# Patient Record
Sex: Female | Born: 1970
Health system: Southern US, Community
[De-identification: ages and names within clinical notes are randomized; demographics above are authoritative.]

## PROBLEM LIST (undated history)

## (undated) DIAGNOSIS — IMO0001 Reserved for inherently not codable concepts without codable children: Secondary | ICD-10-CM

## (undated) DIAGNOSIS — D649 Anemia, unspecified: Secondary | ICD-10-CM

## (undated) DIAGNOSIS — G8929 Other chronic pain: Secondary | ICD-10-CM

## (undated) DIAGNOSIS — I319 Disease of pericardium, unspecified: Secondary | ICD-10-CM

## (undated) DIAGNOSIS — M1990 Unspecified osteoarthritis, unspecified site: Secondary | ICD-10-CM

## (undated) DIAGNOSIS — D7581 Myelofibrosis: Secondary | ICD-10-CM

## (undated) DIAGNOSIS — R519 Headache, unspecified: Secondary | ICD-10-CM

## (undated) DIAGNOSIS — Z5189 Encounter for other specified aftercare: Secondary | ICD-10-CM

## (undated) HISTORY — DX: Unspecified osteoarthritis, unspecified site: M19.90

## (undated) HISTORY — DX: Other chronic pain: G89.29

## (undated) HISTORY — DX: Encounter for other specified aftercare: Z51.89

## (undated) HISTORY — DX: Headache, unspecified: R51.9

## (undated) HISTORY — DX: Anemia, unspecified: D64.9

## (undated) HISTORY — DX: Disease of pericardium, unspecified: I31.9

## (undated) HISTORY — PX: PORT A CATH REVISION: SHX6033

---

## 1998-03-16 ENCOUNTER — Ambulatory Visit (HOSPITAL_COMMUNITY): Admission: RE | Admit: 1998-03-16 | Discharge: 1998-03-16 | Payer: Self-pay | Admitting: Emergency Medicine

## 1998-03-16 ENCOUNTER — Emergency Department (HOSPITAL_COMMUNITY): Admission: EM | Admit: 1998-03-16 | Discharge: 1998-03-16 | Payer: Self-pay | Admitting: Emergency Medicine

## 1998-12-15 ENCOUNTER — Other Ambulatory Visit: Admission: RE | Admit: 1998-12-15 | Discharge: 1998-12-15 | Payer: Self-pay | Admitting: Obstetrics and Gynecology

## 2000-01-19 ENCOUNTER — Other Ambulatory Visit: Admission: RE | Admit: 2000-01-19 | Discharge: 2000-01-19 | Payer: Self-pay | Admitting: Obstetrics and Gynecology

## 2000-03-13 ENCOUNTER — Encounter: Payer: Self-pay | Admitting: Emergency Medicine

## 2000-03-13 ENCOUNTER — Emergency Department (HOSPITAL_COMMUNITY): Admission: EM | Admit: 2000-03-13 | Discharge: 2000-03-13 | Payer: Self-pay | Admitting: Emergency Medicine

## 2000-07-19 ENCOUNTER — Encounter: Payer: Self-pay | Admitting: Family Medicine

## 2000-07-19 ENCOUNTER — Encounter: Admission: RE | Admit: 2000-07-19 | Discharge: 2000-07-19 | Payer: Self-pay | Admitting: Family Medicine

## 2000-07-25 ENCOUNTER — Other Ambulatory Visit: Admission: RE | Admit: 2000-07-25 | Discharge: 2000-07-25 | Payer: Self-pay | Admitting: Family Medicine

## 2000-11-01 ENCOUNTER — Emergency Department (HOSPITAL_COMMUNITY): Admission: EM | Admit: 2000-11-01 | Discharge: 2000-11-02 | Payer: Self-pay | Admitting: *Deleted

## 2000-12-17 ENCOUNTER — Ambulatory Visit (HOSPITAL_COMMUNITY): Admission: RE | Admit: 2000-12-17 | Discharge: 2000-12-17 | Payer: Self-pay | Admitting: Obstetrics and Gynecology

## 2000-12-17 ENCOUNTER — Encounter: Payer: Self-pay | Admitting: Obstetrics and Gynecology

## 2001-01-04 ENCOUNTER — Emergency Department (HOSPITAL_COMMUNITY): Admission: EM | Admit: 2001-01-04 | Discharge: 2001-01-04 | Payer: Self-pay | Admitting: Emergency Medicine

## 2001-01-04 ENCOUNTER — Encounter: Payer: Self-pay | Admitting: Emergency Medicine

## 2001-01-28 ENCOUNTER — Ambulatory Visit (HOSPITAL_COMMUNITY): Admission: RE | Admit: 2001-01-28 | Discharge: 2001-01-28 | Payer: Self-pay | Admitting: Obstetrics and Gynecology

## 2001-01-28 ENCOUNTER — Encounter: Payer: Self-pay | Admitting: Obstetrics and Gynecology

## 2001-03-12 ENCOUNTER — Ambulatory Visit (HOSPITAL_COMMUNITY): Admission: RE | Admit: 2001-03-12 | Discharge: 2001-03-12 | Payer: Self-pay | Admitting: Obstetrics and Gynecology

## 2001-03-12 ENCOUNTER — Encounter (INDEPENDENT_AMBULATORY_CARE_PROVIDER_SITE_OTHER): Payer: Self-pay

## 2001-03-12 ENCOUNTER — Encounter (INDEPENDENT_AMBULATORY_CARE_PROVIDER_SITE_OTHER): Payer: Self-pay | Admitting: Specialist

## 2001-03-27 ENCOUNTER — Encounter: Admission: RE | Admit: 2001-03-27 | Discharge: 2001-04-18 | Payer: Self-pay | Admitting: Family Medicine

## 2001-04-22 ENCOUNTER — Emergency Department (HOSPITAL_COMMUNITY): Admission: EM | Admit: 2001-04-22 | Discharge: 2001-04-22 | Payer: Self-pay | Admitting: Emergency Medicine

## 2001-06-20 ENCOUNTER — Encounter: Payer: Self-pay | Admitting: Emergency Medicine

## 2001-06-20 ENCOUNTER — Emergency Department (HOSPITAL_COMMUNITY): Admission: EM | Admit: 2001-06-20 | Discharge: 2001-06-20 | Payer: Self-pay | Admitting: Emergency Medicine

## 2001-07-24 ENCOUNTER — Ambulatory Visit (HOSPITAL_COMMUNITY): Admission: RE | Admit: 2001-07-24 | Discharge: 2001-07-24 | Payer: Self-pay | Admitting: Infectious Diseases

## 2001-07-24 ENCOUNTER — Encounter: Payer: Self-pay | Admitting: Infectious Diseases

## 2001-07-24 ENCOUNTER — Encounter: Admission: RE | Admit: 2001-07-24 | Discharge: 2001-07-24 | Payer: Self-pay | Admitting: Infectious Diseases

## 2001-08-01 ENCOUNTER — Encounter: Admission: RE | Admit: 2001-08-01 | Discharge: 2001-08-01 | Payer: Self-pay | Admitting: Infectious Diseases

## 2001-08-02 ENCOUNTER — Ambulatory Visit (HOSPITAL_COMMUNITY): Admission: RE | Admit: 2001-08-02 | Discharge: 2001-08-02 | Payer: Self-pay | Admitting: Infectious Diseases

## 2001-08-06 ENCOUNTER — Encounter: Admission: RE | Admit: 2001-08-06 | Discharge: 2001-08-06 | Payer: Self-pay | Admitting: Infectious Diseases

## 2001-08-07 ENCOUNTER — Ambulatory Visit (HOSPITAL_COMMUNITY): Admission: RE | Admit: 2001-08-07 | Discharge: 2001-08-07 | Payer: Self-pay | Admitting: Infectious Diseases

## 2001-08-12 ENCOUNTER — Ambulatory Visit (HOSPITAL_COMMUNITY): Admission: RE | Admit: 2001-08-12 | Discharge: 2001-08-12 | Payer: Self-pay | Admitting: Infectious Diseases

## 2001-08-12 ENCOUNTER — Encounter: Payer: Self-pay | Admitting: Infectious Diseases

## 2001-08-21 ENCOUNTER — Encounter: Admission: RE | Admit: 2001-08-21 | Discharge: 2001-08-21 | Payer: Self-pay | Admitting: Infectious Diseases

## 2001-09-20 ENCOUNTER — Ambulatory Visit (HOSPITAL_COMMUNITY): Admission: RE | Admit: 2001-09-20 | Discharge: 2001-09-20 | Payer: Self-pay | Admitting: Oncology

## 2001-09-20 ENCOUNTER — Encounter (INDEPENDENT_AMBULATORY_CARE_PROVIDER_SITE_OTHER): Payer: Self-pay | Admitting: Specialist

## 2001-10-07 ENCOUNTER — Ambulatory Visit (HOSPITAL_COMMUNITY): Admission: RE | Admit: 2001-10-07 | Discharge: 2001-10-07 | Payer: Self-pay | Admitting: Oncology

## 2001-10-07 ENCOUNTER — Encounter: Payer: Self-pay | Admitting: Oncology

## 2001-10-17 ENCOUNTER — Ambulatory Visit (HOSPITAL_COMMUNITY): Admission: RE | Admit: 2001-10-17 | Discharge: 2001-10-17 | Payer: Self-pay | Admitting: Oncology

## 2001-10-17 ENCOUNTER — Encounter: Payer: Self-pay | Admitting: Oncology

## 2001-10-30 ENCOUNTER — Encounter: Admission: RE | Admit: 2001-10-30 | Discharge: 2001-10-30 | Payer: Self-pay | Admitting: Infectious Diseases

## 2001-11-05 ENCOUNTER — Encounter: Payer: Self-pay | Admitting: Emergency Medicine

## 2001-11-05 ENCOUNTER — Emergency Department (HOSPITAL_COMMUNITY): Admission: EM | Admit: 2001-11-05 | Discharge: 2001-11-06 | Payer: Self-pay | Admitting: Emergency Medicine

## 2002-01-15 ENCOUNTER — Encounter: Admission: RE | Admit: 2002-01-15 | Discharge: 2002-01-15 | Payer: Self-pay | Admitting: Infectious Diseases

## 2002-05-02 ENCOUNTER — Other Ambulatory Visit: Admission: RE | Admit: 2002-05-02 | Discharge: 2002-05-02 | Payer: Self-pay | Admitting: Family Medicine

## 2003-03-18 ENCOUNTER — Encounter: Payer: Self-pay | Admitting: Emergency Medicine

## 2003-03-18 ENCOUNTER — Emergency Department (HOSPITAL_COMMUNITY): Admission: EM | Admit: 2003-03-18 | Discharge: 2003-03-18 | Payer: Self-pay | Admitting: Emergency Medicine

## 2003-06-05 ENCOUNTER — Encounter: Payer: Self-pay | Admitting: Oncology

## 2003-06-05 ENCOUNTER — Encounter: Admission: RE | Admit: 2003-06-05 | Discharge: 2003-06-05 | Payer: Self-pay | Admitting: Oncology

## 2003-06-23 ENCOUNTER — Encounter: Payer: Self-pay | Admitting: Oncology

## 2003-06-23 ENCOUNTER — Encounter: Admission: RE | Admit: 2003-06-23 | Discharge: 2003-06-23 | Payer: Self-pay | Admitting: Oncology

## 2003-11-11 ENCOUNTER — Emergency Department (HOSPITAL_COMMUNITY): Admission: EM | Admit: 2003-11-11 | Discharge: 2003-11-11 | Payer: Self-pay | Admitting: Emergency Medicine

## 2004-01-15 ENCOUNTER — Ambulatory Visit (HOSPITAL_COMMUNITY): Admission: RE | Admit: 2004-01-15 | Discharge: 2004-01-15 | Payer: Self-pay | Admitting: Oncology

## 2004-04-19 ENCOUNTER — Emergency Department (HOSPITAL_COMMUNITY): Admission: EM | Admit: 2004-04-19 | Discharge: 2004-04-19 | Payer: Self-pay | Admitting: Emergency Medicine

## 2004-06-14 ENCOUNTER — Other Ambulatory Visit: Admission: RE | Admit: 2004-06-14 | Discharge: 2004-06-14 | Payer: Self-pay | Admitting: Family Medicine

## 2004-07-15 ENCOUNTER — Ambulatory Visit: Payer: Self-pay | Admitting: Oncology

## 2005-03-05 ENCOUNTER — Emergency Department (HOSPITAL_COMMUNITY): Admission: EM | Admit: 2005-03-05 | Discharge: 2005-03-05 | Payer: Self-pay | Admitting: Emergency Medicine

## 2006-02-07 ENCOUNTER — Other Ambulatory Visit: Admission: RE | Admit: 2006-02-07 | Discharge: 2006-02-07 | Payer: Self-pay | Admitting: Family Medicine

## 2008-07-06 ENCOUNTER — Emergency Department (HOSPITAL_COMMUNITY): Admission: EM | Admit: 2008-07-06 | Discharge: 2008-07-06 | Payer: Self-pay | Admitting: Emergency Medicine

## 2008-09-24 ENCOUNTER — Emergency Department (HOSPITAL_COMMUNITY): Admission: EM | Admit: 2008-09-24 | Discharge: 2008-09-24 | Payer: Self-pay | Admitting: Emergency Medicine

## 2008-11-11 ENCOUNTER — Encounter: Admission: RE | Admit: 2008-11-11 | Discharge: 2008-11-11 | Payer: Self-pay | Admitting: Surgery

## 2009-09-11 HISTORY — PX: GREAT TOE ARTHRODESIS, INTERPHALANGEAL JOINT: SUR55

## 2010-11-23 ENCOUNTER — Other Ambulatory Visit: Payer: Self-pay | Admitting: Hematology & Oncology

## 2010-11-23 ENCOUNTER — Ambulatory Visit (HOSPITAL_BASED_OUTPATIENT_CLINIC_OR_DEPARTMENT_OTHER): Payer: Medicare Other | Admitting: Hematology & Oncology

## 2010-11-23 DIAGNOSIS — D7589 Other specified diseases of blood and blood-forming organs: Secondary | ICD-10-CM

## 2010-11-23 DIAGNOSIS — R718 Other abnormality of red blood cells: Secondary | ICD-10-CM

## 2010-11-23 DIAGNOSIS — M069 Rheumatoid arthritis, unspecified: Secondary | ICD-10-CM

## 2010-11-23 DIAGNOSIS — D759 Disease of blood and blood-forming organs, unspecified: Secondary | ICD-10-CM

## 2010-11-23 LAB — FERRITIN: Ferritin: 6 ng/mL — ABNORMAL LOW (ref 10–291)

## 2010-11-23 LAB — CBC WITH DIFFERENTIAL (CANCER CENTER ONLY)
BASO#: 0 10*3/uL (ref 0.0–0.2)
BASO%: 0.8 % (ref 0.0–2.0)
EOS%: 6.6 % (ref 0.0–7.0)
Eosinophils Absolute: 0.2 10*3/uL (ref 0.0–0.5)
HCT: 26.9 % — ABNORMAL LOW (ref 34.8–46.6)
HGB: 8.3 g/dL — ABNORMAL LOW (ref 11.6–15.9)
LYMPH#: 1.1 10*3/uL (ref 0.9–3.3)
LYMPH%: 31 % (ref 14.0–48.0)
MCH: 24.3 pg — ABNORMAL LOW (ref 26.0–34.0)
MCHC: 30.9 g/dL — ABNORMAL LOW (ref 32.0–36.0)
MCV: 79 fL — ABNORMAL LOW (ref 81–101)
MONO#: 0.3 10*3/uL (ref 0.1–0.9)
MONO%: 9.1 % (ref 0.0–13.0)
NEUT#: 1.9 10*3/uL (ref 1.5–6.5)
NEUT%: 52.5 % (ref 39.6–80.0)
Platelets: 367 10*3/uL (ref 145–400)
RBC: 3.41 10*6/uL — ABNORMAL LOW (ref 3.70–5.32)
RDW: 15.6 % (ref 11.1–15.7)
WBC: 3.6 10*3/uL — ABNORMAL LOW (ref 3.9–10.0)

## 2010-11-23 LAB — CHCC SATELLITE - SMEAR

## 2010-11-23 LAB — LACTATE DEHYDROGENASE: LDH: 152 U/L (ref 94–250)

## 2010-12-02 ENCOUNTER — Encounter (HOSPITAL_BASED_OUTPATIENT_CLINIC_OR_DEPARTMENT_OTHER): Payer: Medicare Other | Admitting: Hematology & Oncology

## 2010-12-02 DIAGNOSIS — D509 Iron deficiency anemia, unspecified: Secondary | ICD-10-CM

## 2011-01-26 ENCOUNTER — Encounter (HOSPITAL_BASED_OUTPATIENT_CLINIC_OR_DEPARTMENT_OTHER): Payer: Medicare Other | Admitting: Hematology & Oncology

## 2011-01-26 ENCOUNTER — Other Ambulatory Visit: Payer: Self-pay | Admitting: Hematology & Oncology

## 2011-01-26 DIAGNOSIS — D509 Iron deficiency anemia, unspecified: Secondary | ICD-10-CM

## 2011-01-26 DIAGNOSIS — D7589 Other specified diseases of blood and blood-forming organs: Secondary | ICD-10-CM

## 2011-01-26 DIAGNOSIS — M069 Rheumatoid arthritis, unspecified: Secondary | ICD-10-CM

## 2011-01-26 DIAGNOSIS — D649 Anemia, unspecified: Secondary | ICD-10-CM

## 2011-01-26 LAB — CBC WITH DIFFERENTIAL (CANCER CENTER ONLY)
BASO#: 0 10*3/uL (ref 0.0–0.2)
BASO%: 0.6 % (ref 0.0–2.0)
EOS%: 6 % (ref 0.0–7.0)
Eosinophils Absolute: 0.2 10*3/uL (ref 0.0–0.5)
HCT: 32.7 % — ABNORMAL LOW (ref 34.8–46.6)
HGB: 11 g/dL — ABNORMAL LOW (ref 11.6–15.9)
LYMPH#: 1 10*3/uL (ref 0.9–3.3)
LYMPH%: 32.4 % (ref 14.0–48.0)
MCH: 28.1 pg (ref 26.0–34.0)
MCHC: 33.6 g/dL (ref 32.0–36.0)
MCV: 84 fL (ref 81–101)
MONO#: 0.3 10*3/uL (ref 0.1–0.9)
MONO%: 10.2 % (ref 0.0–13.0)
NEUT#: 1.6 10*3/uL (ref 1.5–6.5)
NEUT%: 50.8 % (ref 39.6–80.0)
Platelets: 272 10*3/uL (ref 145–400)
RBC: 3.91 10*6/uL (ref 3.70–5.32)
RDW: 17.5 % — ABNORMAL HIGH (ref 11.1–15.7)
WBC: 3.2 10*3/uL — ABNORMAL LOW (ref 3.9–10.0)

## 2011-01-26 LAB — RETICULOCYTES (CHCC)
ABS Retic: 23.8 10*3/uL (ref 19.0–186.0)
RBC.: 3.97 MIL/uL (ref 3.87–5.11)
Retic Ct Pct: 0.6 % (ref 0.4–3.1)

## 2011-01-26 LAB — SEDIMENTATION RATE: Sed Rate: 22 mm/hr (ref 0–22)

## 2011-01-26 LAB — FERRITIN: Ferritin: 24 ng/mL (ref 10–291)

## 2011-01-26 LAB — CHCC SATELLITE - SMEAR

## 2011-01-27 NOTE — Op Note (Signed)
[facility]  Patient:    [name] [name], [name] [name] Visit Number: [id number] MRN: [id number]          Service Type: OUT Location: OMED Attending Physician:  Pierce Crane Dictated by:   Pierce Crane, M.D. Proc. Date: 09/20/01 Admit Date:  09/20/2001                             Operative Report  DATE OF BIRTH:  1970/11/18  PROCEDURE:  Bone marrow biopsy.  INDICATION:  [name] is a 40 year old African-American female with a known history of unexplained anemia, underlying inflammatory disease not yet diagnosed.  DESCRIPTION OF PROCEDURE:  With the patient in a supine position, 10 mg of Versed and 25 mg of Demerol were given.  A bone marrow aspirate and biopsy were performed.  Xylocaine 2% was used for local anesthesia.  The patient tolerated the procedure well, and she will be seen in follow-up to discuss results of bone marrow. Dictated by:   Pierce Crane, M.D. Attending Physician:  Pierce Crane DD:  09/20/01 TD:  09/20/01 Job: [id number]

## 2011-01-27 NOTE — H&P (Signed)
[facility]  Patient:    [name] [name], [name] [name]                          MRN: [id number] Adm. Date:  03/12/01 Attending:  Erie Noe P. Pennie Rushing, M.D. Dictator:   Henreitta Leber, P.A.                         History and Physical  DATE OF BIRTH:                10-24-1970  HISTORY OF PRESENT ILLNESS:   [name] [name] is a 40 year old single African-American female, para 2-0-1-2, with a five-month history of abdominal pain accompanied by abdominal swelling, pelvic pressure and cramping. Patients symptoms worsened at night and with urination/defecation.  She was seen on November 01, 2000 at [facility] and received a pelvic ultrasound and abdominopelvic CT scan.  Pelvic ultrasound showed abnormal soft tissue/hematoma/enlarged ovary in adnexal regions bilaterally with most components being solid; the right mass measures 4.3 x 7.4 x 5.2 cm with a 1.7-cm simple-appearing cyst; left mass measures 3.5 x 5.9 x 3.6 cm and contains a simple 1.5-cm cyst.  There is a moderate amount of free fluid noted in the abdomen and pelvis.  Pelvic CT at that same time revealed a right adnexal mass which is primarily solid, as well as an adjacent probable right ovarian cyst; this could represent either infection or tumor.  Patient was treated with Rocephin 250 mg IM and doxycycline 100 mg twice daily for seven days; she subsequently had a negative gonorrhea/Chlamydia culture and a normal CBC with the exception of a hemoglobin of 11.5.  Patients pain resolved following antibiotic therapy; however, a followup pelvic ultrasound on Jan 28, 2001 revealed a mixed echogenic right adnexal mass, some of which lies immediately adjacent to the right aspect of the uterus and therefore, a pedunculated right uterine fibroid extending to the right adnexa is difficult to exclude; however, there does appear to be an interface between this right solid adnexal mass and uterus, favoring a  right ovarian lesion.  The right ovary, inclusive of the cyst, measures 6.2 x 4.0 x 3.3 cm with no significant change since previous study; left ovary is normal with normal follicles. Patient was found to have a CA125 in normal range.  Due to the persistent nature of patients right adnexal mass, she has consented to a diagnostic laparoscopy with the possibility of a laparotomy.  OBSTETRICAL HISTORY:          Gravida 3, para 2-0-1-2; both of patients children were delivered by C-section; she experienced anemia and hyperemesis during pregnancy as well as postpartum depression.  GYNECOLOGICAL HISTORY:        Menarche -- 40 years old.  Her periods are regular, with severe cramps beginning only in the last three months; she has a remote history of Trichomonas vaginitis but no other sexually transmitted diseases; has a history of an abnormal Pap smear in 2002 and is scheduled for a repeat Pap smear in June of 2002; patient uses condoms for contraception.  PAST MEDICAL HISTORY:         Medical history is positive for anemia, sickle cell trait and scoliosis.  SURGICAL HISTORY:             Laparoscopy for lysis of adhesions and cesarean section x 2.  FAMILY HISTORY:  Positive for breast cancer (maternal grandmother), hypertension, insulin-dependent diabetes.  SOCIAL HISTORY:               Patient is single and she is unemployed.  HABITS:                       She does not use tobacco.  She occasionally drinks alcohol.  CURRENT MEDICATIONS:          None.  ALLERGIES:                    ASPIRIN which causes shortness of breath.  REVIEW OF SYSTEMS:            Negative.  PHYSICAL EXAMINATION:  VITAL SIGNS:                  Blood pressure is 90/60.  Weight is 133 pounds. Height is 5 feet 6-1/4 inches tall.  ENT:                          Within normal limits.  NECK:                         Thyroid is not enlarged.  HEART:                        Regular rate and rhythm.   There is no murmur.  LUNGS:                        Without wheezes, rales or rhonchi.  BACK:                         Without CVA tenderness.  ABDOMEN:                      Bowel sounds are present.  It is soft, nontender.  EXTREMITIES:                  There is no clubbing, cyanosis or edema.  PELVIC:                       EG/BUS is within normal limits.  Vagina is rugous.  Cervix is nontender without lesions.  Uterus is upper limits of normal size, is retroverted and without tenderness.  Adnexa:  There is a mobile, slightly tender mass in the right adnexa; otherwise, there is no pelvic tenderness or other palpable masses.  Rectovaginal without masses or tenderness.  IMPRESSION:                   1. Persistent right adnexal mass -- solid.                               2. Pelvic pain.  DISPOSITION:                  A discussion was held with patient regarding the implications for her procedure as well as the potential benefits and risks which include, but are not limited to, reactions to anesthesia, damage to adjacent organs, infection and bleeding.  Patient had consented to undergo a diagnostic laparoscopy with the possibility of a laparotomy and removal of a right adnexal mass on March 12, 2001 at [facility] at 9:15 a.m. DD:  02/25/01 TD:  02/25/01 Job: [id number]

## 2011-01-27 NOTE — Discharge Summary (Signed)
[facility]  Patient:    [name], [name]                    MRN: [id number] Adm. Date:  [id number] Disc. Date: 03/12/01 Attending:  Shaune Spittle Dictator:   Henreitta Leber, P.A.                           Discharge Summary  DATE OF BIRTH:                1971-05-23.  DISCHARGE DIAGNOSES:          1. Solid pelvic mass.                               2. Pelvic pain.                               3. Bilateral tubal cyst.  PROCEDURE:                    On March 12, 2001 the patient underwent an operative laparoscopy with excision of a solid pelvic mass, aspiration of a right paratubal cyst, and #3 left paratubal cystectomies.  HISTORY OF PRESENT ILLNESS:   [name] [name] is a 40 year old single African-American female, para 2-0-1-2, with a five-month history of abdominal pain accompanied by abdominal swelling, pelvic pressure, and cramping. The patient was found to have on pelvic ultrasound (November 01, 2000) a solid right pelvic mass measuring 4.3 x 7.4 x 5.2 cm. A followup ultrasound in May of 2002 did not reveal any significant change from previous study. The patient presents for diagnostic laparoscopy for removal of this mass. Please see patients dictated history and physical exam for details.  PHYSICAL EXAMINATION:  VITAL SIGNS:                  Blood pressure is 90/60, weight is 133 pounds, height is 5 feet 6-1/4 inches tall.  GENERAL:                      Within normal limits.  PELVIC:                       EGBUS is within normal limits. Vagina is rugous, cervix is nontender without lesions. Uterus is upper limits of normal size, is retroverted and without tenderness. Adnexa: There appears to a mobile, slightly tender mass in the right adnexal region, otherwise, there is no pelvic tenderness or other palpable masses.  RECTOVAGINAL:                 Exam does reveal any masses or tenderness.  HOSPITAL COURSE:              Patient  underwent an operative laparoscopy which resulted in the excision of an approximately 4 cm pelvic mass, the aspiration of a right paratubal cyst, and the removal of three left paratubal cysts. The patient tolerated all procedures well and was discharged from PACU to home in stable condition.  DISCHARGE MEDICATIONS:        1. Doxycycline 100 mg, 14 tablets, one tablet  twice daily for seven days.                               2. Vicodin, 30 tablets, one to two tablets                                  every four to six hours as needed for pain.  FOLLOWUP INSTRUCTIONS:        The patient is to be seen by Dr. Pennie Rushing on March 26, 2001 at 2:15 p.m.  DISCHARGE INSTRUCTIONS:       The patient was given a copy of the [facility] home care instructions for laparoscopy.  FINAL PATHOLOGY:              Final pathology was not available at the time of patients discharge. DD:  03/12/01 TD:  03/12/01 Job: [id number]

## 2011-01-27 NOTE — Op Note (Signed)
[facility]  Patient:    [name] [name], [name] [name]                    MRN: [id number] Proc. Date: 03/12/01 Adm. Date:  [id number] Attending:  Dierdre Forth Pearline                           Operative Report  PREOPERATIVE DIAGNOSIS:       Persistent pelvic mass.  POSTOPERATIVE DIAGNOSIS:      1. Bilateral paratubal cysts.                               2. Pelvic mass, unattached to any of the                                  surrounding tissue, found in the posterior                                  cul-de-sac.  OPERATION:                    1. Operative laparoscopy.                               2. Removal of left paratubal cyst.                               3. Aspiration of right paratubal cyst.                               4. Removal of pelvic mass.  SURGEON:                      Vanessa P. Haygood, M.D.  ASSISTANT:                    Henreitta Leber, P.A.-C.  ANESTHESIA:                   General orotracheal.  ESTIMATED BLOOD LOSS:         Less than 25 cc.  COMPLICATIONS:                None.  FINDINGS:                     The uterus was normal-sized, with filmy adhesions over the fundus, which had previously been lysed.  The tubes bilaterally were sealed at the fimbriated ends.  EAch contained several paratubal cysts, the largest of which was approximately 3.0 cm.  The ovaries appeared within normal limits.  There was free floating in the posterior cul-de-sac, except for a few filmy adhesions to the posterior cul-de-sac peritoneum.  A 3.0 cm mass that was smooth and appeared to have dark clotted material within it.  There were no excrescences.  There were no other visualized lesions.  DESCRIPTION OF PROCEDURE:     The patient was taken to the operating room after appropriate identification, and placed on the operating room table. After obtaining adequate general anesthesia she was placed in the modified  lithotomy position.  The abdomen,  perineum, and vagina were prepped with multiple layers of Betadine.  A Foley catheter was inserted into the bladder under sterile conditions, and connected to straight drainage.  A Hulka tenaculum was placed on the anterior cervix.  The abdomen was draped as a sterile field.  A subumbilical injection of 0.25% Marcaine was undertaken for 6 cc, and then 2 cc on either side of the suprapubic area.  A subumbilical incision was made, and that incision was taken down to the fascia, which was grasped and incised to enter the peritoneum sharply.  A Hasson cannula was placed through the incision into the peritoneal cavity after stay sutures of #0 Vicryl had been placed.  The stay sutures were then wrapped around the Hasson cannula to form a seal, and a pneumoperitoneum created with 3 L of CO2. The laparoscope was then placed through the trocar sleeve.  Suprapubic incisions to the right and left of midline were made, and laparoscopic probe trocars were placed through those incisions into the peritoneal cavity under direct visualization.  The afore-noted findings were made and documented.  The 3.0 cm pelvic mass lesion that was in the posterior cul-de-sac was then placed in an Endo bag and brought through the subumbilical incision and then removed from the operative field.  Prior to any of this taking place, peritoneal washings had been obtained via a Nazat suction aspirator.  The paratubal cyst on the right fallopian tube was then aspirated.  Three paratubal cysts were then removed from the left fallopian tube.  Hemostasis was noted to be adequate.  Copious irrigation was carried out under approximately 100 cc of warm lactated Ringers, and left in the peritoneal cavity.  All instruments were then removed from the peritoneal cavity under direct visualization, as the CO2 was allowed to escape.  The subumbilical incision was closed with the #0 Vicryl sutures which had been held in a figure-of-eight  fashion.  A subcuticular suture of #3-0 Vicryl was then used to close the skin incision. This same suture was used to close the skin incisions of the two suprapubic incisions.  Sterile dressings were applied.  The Foley catheter and Hulka tenaculum were removed, and the patient was awakened from general anesthesia, and then taken to the recovery room in satisfactory condition, having tolerated the procedure well, with the sponge and instrument counts correct. DD:  03/12/01 TD:  03/12/01 Job: [id number]

## 2011-04-19 ENCOUNTER — Other Ambulatory Visit: Payer: Self-pay | Admitting: Hematology & Oncology

## 2011-04-19 ENCOUNTER — Encounter (HOSPITAL_BASED_OUTPATIENT_CLINIC_OR_DEPARTMENT_OTHER): Payer: Medicare Other | Admitting: Hematology & Oncology

## 2011-04-19 DIAGNOSIS — D7589 Other specified diseases of blood and blood-forming organs: Secondary | ICD-10-CM

## 2011-04-19 DIAGNOSIS — D649 Anemia, unspecified: Secondary | ICD-10-CM

## 2011-04-19 DIAGNOSIS — D474 Osteomyelofibrosis: Secondary | ICD-10-CM

## 2011-04-19 DIAGNOSIS — D509 Iron deficiency anemia, unspecified: Secondary | ICD-10-CM

## 2011-04-19 LAB — IRON AND TIBC
%SAT: 29 % (ref 20–55)
Iron: 97 ug/dL (ref 42–145)
TIBC: 337 ug/dL (ref 250–470)
UIBC: 240 ug/dL

## 2011-04-19 LAB — CBC WITH DIFFERENTIAL (CANCER CENTER ONLY)
BASO#: 0 10*3/uL (ref 0.0–0.2)
BASO%: 0.6 % (ref 0.0–2.0)
EOS%: 6.2 % (ref 0.0–7.0)
Eosinophils Absolute: 0.2 10*3/uL (ref 0.0–0.5)
HCT: 32.8 % — ABNORMAL LOW (ref 34.8–46.6)
HGB: 11.2 g/dL — ABNORMAL LOW (ref 11.6–15.9)
LYMPH#: 1.2 10*3/uL (ref 0.9–3.3)
LYMPH%: 34.4 % (ref 14.0–48.0)
MCH: 30.6 pg (ref 26.0–34.0)
MCHC: 34.1 g/dL (ref 32.0–36.0)
MCV: 90 fL (ref 81–101)
MONO#: 0.4 10*3/uL (ref 0.1–0.9)
MONO%: 10.1 % (ref 0.0–13.0)
NEUT#: 1.7 10*3/uL (ref 1.5–6.5)
NEUT%: 48.7 % (ref 39.6–80.0)
Platelets: 275 10*3/uL (ref 145–400)
RBC: 3.66 10*6/uL — ABNORMAL LOW (ref 3.70–5.32)
RDW: 11.9 % (ref 11.1–15.7)
WBC: 3.6 10*3/uL — ABNORMAL LOW (ref 3.9–10.0)

## 2011-04-19 LAB — RETICULOCYTES (CHCC)
ABS Retic: 37.1 10*3/uL (ref 19.0–186.0)
RBC.: 3.71 MIL/uL — ABNORMAL LOW (ref 3.87–5.11)
Retic Ct Pct: 1 % (ref 0.4–2.3)

## 2011-04-19 LAB — CHCC SATELLITE - SMEAR

## 2011-04-19 LAB — SEDIMENTATION RATE: Sed Rate: 20 mm/hr (ref 0–22)

## 2011-04-19 LAB — FERRITIN: Ferritin: 13 ng/mL (ref 10–291)

## 2011-07-03 ENCOUNTER — Encounter: Payer: Self-pay | Admitting: *Deleted

## 2011-07-05 ENCOUNTER — Encounter: Payer: Self-pay | Admitting: *Deleted

## 2011-07-11 ENCOUNTER — Other Ambulatory Visit: Payer: Self-pay | Admitting: Family

## 2011-07-11 DIAGNOSIS — D509 Iron deficiency anemia, unspecified: Secondary | ICD-10-CM | POA: Insufficient documentation

## 2011-07-24 ENCOUNTER — Other Ambulatory Visit: Payer: Medicare Other | Admitting: Lab

## 2011-07-24 ENCOUNTER — Ambulatory Visit: Payer: Medicare Other | Admitting: Hematology & Oncology

## 2013-02-10 ENCOUNTER — Other Ambulatory Visit: Payer: Self-pay

## 2013-07-23 ENCOUNTER — Other Ambulatory Visit: Payer: Self-pay | Admitting: Family Medicine

## 2013-07-23 DIAGNOSIS — N63 Unspecified lump in unspecified breast: Secondary | ICD-10-CM

## 2013-07-23 DIAGNOSIS — R2231 Localized swelling, mass and lump, right upper limb: Secondary | ICD-10-CM

## 2013-07-30 ENCOUNTER — Encounter: Payer: Self-pay | Admitting: Obstetrics & Gynecology

## 2013-07-30 ENCOUNTER — Ambulatory Visit (INDEPENDENT_AMBULATORY_CARE_PROVIDER_SITE_OTHER): Payer: Medicare Other | Admitting: Obstetrics & Gynecology

## 2013-07-30 ENCOUNTER — Other Ambulatory Visit (HOSPITAL_COMMUNITY)
Admission: RE | Admit: 2013-07-30 | Discharge: 2013-07-30 | Disposition: A | Discharge status: Home / Self Care | Payer: Medicare Other | Source: Ambulatory Visit | Attending: Obstetrics & Gynecology | Admitting: Obstetrics & Gynecology

## 2013-07-30 VITALS — BP 116/78 | HR 66 | Temp 97.6°F | Ht 66.0 in | Wt 185.3 lb

## 2013-07-30 DIAGNOSIS — N925 Other specified irregular menstruation: Secondary | ICD-10-CM

## 2013-07-30 DIAGNOSIS — Z124 Encounter for screening for malignant neoplasm of cervix: Secondary | ICD-10-CM | POA: Insufficient documentation

## 2013-07-30 DIAGNOSIS — Z1151 Encounter for screening for human papillomavirus (HPV): Secondary | ICD-10-CM | POA: Insufficient documentation

## 2013-07-30 DIAGNOSIS — R87619 Unspecified abnormal cytological findings in specimens from cervix uteri: Secondary | ICD-10-CM | POA: Insufficient documentation

## 2013-07-30 DIAGNOSIS — Z01419 Encounter for gynecological examination (general) (routine) without abnormal findings: Secondary | ICD-10-CM

## 2013-07-30 DIAGNOSIS — N949 Unspecified condition associated with female genital organs and menstrual cycle: Secondary | ICD-10-CM

## 2013-07-30 DIAGNOSIS — N938 Other specified abnormal uterine and vaginal bleeding: Secondary | ICD-10-CM

## 2013-07-30 MED ORDER — MEDROXYPROGESTERONE ACETATE 10 MG PO TABS: 10.0000 mg | ORAL_TABLET | Freq: Every day | ORAL | Status: DC

## 2013-07-30 NOTE — Progress Notes (Signed)
Patient ID: [name] [name], female   DOB: 1970-12-21, 42 y.o.   MRN: [id number] Subjective:     NAZIAH [name] is a 42 y.o. female here for a routine exam.  Current complaints: pt c/o bleeding for 7 days . She reports that the bleeding is heavy.  She is concerned that it is related to her chemotherapy.  She denies pain with the bleeding. Pt reports that the mass under her arm has been there for a few years.  She was told that it was a lipoma.  She says that it has been getting larger and increases in size when she is on her menses. Gynecologic History Patient's last menstrual period was 07/24/2013. Last Pap: 2012. Results were: normal Last mammogram: 2008. Results were: normal. She was dx'd with a lipoma under her right armpit  Obstetric History OB History  No data available   Past Surgical History  Procedure Laterality Date  . Cesarean section    portacath placement  The following portions of the patient's history were reviewed and updated as appropriate: allergies, current medications, past family history, past medical history, past social history, past surgical history and problem list.  Review of Systems Pertinent items are noted in HPI.    Objective:    BP 116/78  Pulse 66  Temp(Src) 97.6 F (36.4 C)  Ht 5\' 6"  (1.676 m)  Wt 185 lb 4.8 oz (84.052 kg)  BMI 29.92 kg/m2  LMP 07/24/2013  General Appearance:    Alert, cooperative, no distress, appears stated age              Throat:   Lips, mucosa, and tongue normal; teeth and gums normal  Neck:   Supple, symmetrical, trachea midline, no adenopathy;    thyroid:  no enlargement/tenderness/nodules; no carotid   bruit or JVD  Back:     Symmetric, no curvature, ROM normal, no CVA tenderness  Lungs:     Clear to auscultation bilaterally, respirations unlabored  Chest Wall:    No tenderness or deformity   Heart:    Regular rate and rhythm, S1 and S2 normal, no murmur, rub   or gallop  Breast Exam:    No tenderness, masses,  or nipple abnormality. Sift mobile mass under right arm  Abdomen:     Soft, non-tender, bowel sounds active all four quadrants,    no masses, no organomegaly; well healed vertical incision  Genitalia:    Normal female without lesion, discharge or tenderness;vagina: blood in vualt uterus small and mobile  Rectal:    Normal tone, normal prostate, no masses or tenderness;   guaiac negative stool  Extremities:   Extremities normal, atraumatic, no cyanosis or edema  Pulses:   2+ and symmetric all extremities  Skin:   Skin color, texture, turgor normal, no rashes or lesions            Assessment:    Healthy female exam.  Mass under right arm- consistent with a lipoma but with the report of size change, I recommend surgical excision Menorrhagia- need to eval endometrium for pathology.  Will obtain sono and then endo bx    Plan:    Follow up in: 2 weeks.   Needs Endobx at next visit Pelvic sono Provera 10mg  q day if the bleeding persists and pt decides on tx Referral to Gen surgery for removal of right axillary mass

## 2013-07-30 NOTE — Patient Instructions (Signed)
Endometrial Biopsy Endometrial biopsy is a procedure in which a tissue sample is taken from inside the uterus. The tissue sample is then looked at under a microscope to see if the tissue is normal or abnormal. The endometrium is the lining of the uterus. This procedure helps determine where you are in your menstrual cycle and how hormone levels are affecting the lining of the uterus. This procedure may also be used to evaluate uterine bleeding or to diagnose endometrial cancer, tuberculosis, polyps, or inflammatory conditions.  LET YOUR HEALTH CARE PROVIDER KNOW ABOUT:  Any allergies you have.  All medicines you are taking, including vitamins, herbs, eye drops, creams, and over-the-counter medicines.  Previous problems you or members of your family have had with the use of anesthetics.  Any blood disorders you have.  Previous surgeries you have had.  Medical conditions you have.  Possibility of pregnancy. RISKS AND COMPLICATIONS Generally, this is a safe procedure. However, as with any procedure, complications can occur. Possible complications include:  Bleeding.  Pelvic infection.  Puncture of the uterine wall with the biopsy device (rare). BEFORE THE PROCEDURE   Keep a record of your menstrual cycles as directed by your health care provider. You may need to schedule your procedure for a specific time in your cycle.  You may want to bring a sanitary pad to wear home after the procedure.  Arrange for someone to drive you home after the procedure if you will be given a medicine to help you relax (sedative). PROCEDURE   You may be given a sedative to relax you.  You will lie on an exam table with your feet and legs supported as in a pelvic exam.  Your health care provider will insert an instrument (speculum) into your vagina to see your cervix.  Your cervix will be cleansed with an antiseptic solution. A medicine (local anesthetic) will be used to numb the cervix.  A forceps  instrument (tenaculum) will be used to hold your cervix steady for the biopsy.  A thin, rodlike instrument (uterine sound) will be inserted through your cervix to determine the length of your uterus and the location where the biopsy sample will be removed.  A thin, flexible tube (catheter) will be inserted through your cervix and into the uterus. The catheter is used to collect the biopsy sample from your endometrial tissue.  The catheter and speculum will then be removed, and the tissue sample will be sent to a lab for examination. AFTER THE PROCEDURE  You will rest in a recovery area until you are ready to go home.  You may have mild cramping and a small amount of vaginal bleeding for a few days after the procedure. This is normal.  Make sure you find out how to get your test results. Document Released: 12/29/2004 Document Revised: 04/30/2013 Document Reviewed: 02/12/2013 [employer] Patient Information 2014 [employer]. Menorrhagia Dysfunctional uterine bleeding is different from a normal menstrual period. When periods are heavy or there is more bleeding than is usual for you, it is called menorrhagia. It may be caused by hormonal imbalance, or physical, metabolic, or other problems. Examination is necessary in order that your caregiver may treat treatable causes. If this is a continuing problem, a D&C may be needed. That means that the cervix (the opening of the uterus or womb) is dilated (stretched larger) and the lining of the uterus is scraped out. The tissue scraped out is then examined under a microscope by a specialist (pathologist) to make sure   there is nothing of concern that needs further or more extensive treatment. HOME CARE INSTRUCTIONS   If medications were prescribed, take exactly as directed. Do not change or switch medications without consulting your caregiver.  Long term heavy bleeding may result in iron deficiency. Your caregiver may have prescribed iron pills. They help  replace the iron your body lost from heavy bleeding. Take exactly as directed. Iron may cause constipation. If this becomes a problem, increase the bran, fruits, and roughage in your diet.  Do not take aspirin or medicines that contain aspirin one week before or during your menstrual period. Aspirin may make the bleeding worse.  If you need to change your sanitary pad or tampon more than once every 2 hours, stay in bed and rest as much as possible until the bleeding stops.  Eat well-balanced meals. Eat foods high in iron. Examples are leafy green vegetables, meat, liver, eggs, and whole grain breads and cereals. Do not try to lose weight until the abnormal bleeding has stopped and your blood iron level is back to normal. SEEK MEDICAL CARE IF:   You need to change your sanitary pad or tampon more than once an hour.  You develop nausea (feeling sick to your stomach) and vomiting, dizziness, or diarrhea while you are taking your medicine.  You have any problems that may be related to the medicine you are taking. SEEK IMMEDIATE MEDICAL CARE IF:   You have a fever.  You develop chills.  You develop severe bleeding or start to pass blood clots.  You feel dizzy or faint. MAKE SURE YOU:   Understand these instructions.  Will watch your condition.  Will get help right away if you are not doing well or get worse. Document Released: 08/28/2005 Document Revised: 11/20/2011 Document Reviewed: 02/16/2013 [employer] Patient Information 2014 [employer].  

## 2013-08-05 ENCOUNTER — Ambulatory Visit (HOSPITAL_COMMUNITY)
Admission: RE | Admit: 2013-08-05 | Discharge: 2013-08-05 | Disposition: A | Discharge status: Home / Self Care | Payer: Medicare Other | Source: Ambulatory Visit | Attending: Obstetrics & Gynecology | Admitting: Obstetrics & Gynecology

## 2013-08-05 DIAGNOSIS — N83209 Unspecified ovarian cyst, unspecified side: Secondary | ICD-10-CM | POA: Insufficient documentation

## 2013-08-05 DIAGNOSIS — N949 Unspecified condition associated with female genital organs and menstrual cycle: Secondary | ICD-10-CM | POA: Insufficient documentation

## 2013-08-05 DIAGNOSIS — N938 Other specified abnormal uterine and vaginal bleeding: Secondary | ICD-10-CM | POA: Insufficient documentation

## 2013-08-05 DIAGNOSIS — N925 Other specified irregular menstruation: Secondary | ICD-10-CM | POA: Insufficient documentation

## 2013-08-05 DIAGNOSIS — N854 Malposition of uterus: Secondary | ICD-10-CM | POA: Insufficient documentation

## 2013-08-06 ENCOUNTER — Ambulatory Visit
Admission: RE | Admit: 2013-08-06 | Discharge: 2013-08-06 | Disposition: A | Discharge status: Home / Self Care | Payer: Medicare Other | Source: Ambulatory Visit | Attending: Family Medicine | Admitting: Family Medicine

## 2013-08-06 DIAGNOSIS — N63 Unspecified lump in unspecified breast: Secondary | ICD-10-CM

## 2013-08-12 ENCOUNTER — Encounter (INDEPENDENT_AMBULATORY_CARE_PROVIDER_SITE_OTHER): Payer: Self-pay | Admitting: General Surgery

## 2013-08-12 ENCOUNTER — Ambulatory Visit (INDEPENDENT_AMBULATORY_CARE_PROVIDER_SITE_OTHER): Payer: Medicare Other | Admitting: General Surgery

## 2013-08-12 VITALS — BP 132/76 | HR 81 | Temp 98.0°F | Resp 18 | Ht 66.0 in | Wt 190.0 lb

## 2013-08-12 DIAGNOSIS — D1720 Benign lipomatous neoplasm of skin and subcutaneous tissue of unspecified limb: Secondary | ICD-10-CM

## 2013-08-12 DIAGNOSIS — D1739 Benign lipomatous neoplasm of skin and subcutaneous tissue of other sites: Secondary | ICD-10-CM

## 2013-08-12 NOTE — Progress Notes (Signed)
The patient comes in with a large right axillary lipoma. On by her report there is a hard nodule associated with it which I cannot palpate on examination today.  On examination she has a 3 x 4 cm right axillary lipoma. It is very mobile. It is moderately tender with deep palpation on the inferior border. I cannot palpate any adenopathy associated with this.  The patient has a history of myelofibrosis for which she is taking Remicade. Because of her disease and possibly a medication she is at some risk for increased infection however I've offered the patient the operation of removing the lipoma. The patient wants to think about this prior to committing to undergoing procedure. Hopefully we will hear from her in the future.

## 2013-08-13 ENCOUNTER — Encounter: Payer: Self-pay | Admitting: Obstetrics & Gynecology

## 2013-08-13 ENCOUNTER — Ambulatory Visit (INDEPENDENT_AMBULATORY_CARE_PROVIDER_SITE_OTHER): Payer: Medicare Other | Admitting: Obstetrics & Gynecology

## 2013-08-13 VITALS — BP 114/81 | HR 63 | Temp 98.0°F | Resp 20 | Ht 66.0 in | Wt 188.6 lb

## 2013-08-13 DIAGNOSIS — Z3049 Encounter for surveillance of other contraceptives: Secondary | ICD-10-CM

## 2013-08-13 DIAGNOSIS — D7581 Myelofibrosis: Secondary | ICD-10-CM | POA: Insufficient documentation

## 2013-08-13 DIAGNOSIS — Z30430 Encounter for insertion of intrauterine contraceptive device: Secondary | ICD-10-CM

## 2013-08-13 DIAGNOSIS — N920 Excessive and frequent menstruation with regular cycle: Secondary | ICD-10-CM

## 2013-08-13 DIAGNOSIS — Z01812 Encounter for preprocedural laboratory examination: Secondary | ICD-10-CM

## 2013-08-13 MED ORDER — LEVONORGESTREL 20 MCG/24HR IU IUD
INTRAUTERINE_SYSTEM | Freq: Once | INTRAUTERINE | Status: AC
  Administered 2013-08-13: 16:00:00 via INTRAUTERINE

## 2013-08-13 NOTE — Patient Instructions (Signed)
Levonorgestrel intrauterine device (IUD) What is this medicine? LEVONORGESTREL IUD (LEE voe nor jes trel) is a contraceptive (birth control) device. The device is placed inside the uterus by a healthcare professional. It is used to prevent pregnancy and can also be used to treat heavy bleeding that occurs during your period. Depending on the device, it can be used for 3 to 5 years. This medicine may be used for other purposes; ask your health care provider or pharmacist if you have questions. COMMON BRAND NAME(S): Mirena, Skyla What should I tell my health care provider before I take this medicine? They need to know if you have any of these conditions: -abnormal Pap smear -cancer of the breast, uterus, or cervix -diabetes -endometritis -genital or pelvic infection now or in the past -have more than one sexual partner or your partner has more than one partner -heart disease -history of an ectopic or tubal pregnancy -immune system problems -IUD in place -liver disease or tumor -problems with blood clots or take blood-thinners -use intravenous drugs -uterus of unusual shape -vaginal bleeding that has not been explained -an unusual or allergic reaction to levonorgestrel, other hormones, silicone, or polyethylene, medicines, foods, dyes, or preservatives -pregnant or trying to get pregnant -breast-feeding How should I use this medicine? This device is placed inside the uterus by a health care professional. Talk to your pediatrician regarding the use of this medicine in children. Special care may be needed. Overdosage: If you think you have taken too much of this medicine contact a poison control center or emergency room at once. NOTE: This medicine is only for you. Do not share this medicine with others. What if I miss a dose? This does not apply. What may interact with this medicine? Do not take this medicine with any of the following  medications: -amprenavir -bosentan -fosamprenavir This medicine may also interact with the following medications: -aprepitant -barbiturate medicines for inducing sleep or treating seizures -bexarotene -griseofulvin -medicines to treat seizures like carbamazepine, ethotoin, felbamate, oxcarbazepine, phenytoin, topiramate -modafinil -pioglitazone -rifabutin -rifampin -rifapentine -some medicines to treat HIV infection like atazanavir, indinavir, lopinavir, nelfinavir, tipranavir, ritonavir -St. John's wort -warfarin This list may not describe all possible interactions. Give your health care provider a list of all the medicines, herbs, non-prescription drugs, or dietary supplements you use. Also tell them if you smoke, drink alcohol, or use illegal drugs. Some items may interact with your medicine. What should I watch for while using this medicine? Visit your doctor or health care professional for regular check ups. See your doctor if you or your partner has sexual contact with others, becomes HIV positive, or gets a sexual transmitted disease. This product does not protect you against HIV infection (AIDS) or other sexually transmitted diseases. You can check the placement of the IUD yourself by reaching up to the top of your vagina with clean fingers to feel the threads. Do not pull on the threads. It is a good habit to check placement after each menstrual period. Call your doctor right away if you feel more of the IUD than just the threads or if you cannot feel the threads at all. The IUD may come out by itself. You may become pregnant if the device comes out. If you notice that the IUD has come out use a backup birth control method like condoms and call your health care provider. Using tampons will not change the position of the IUD and are okay to use during your period. What side effects may I   notice from receiving this medicine? Side effects that you should report to your doctor or  health care professional as soon as possible: -allergic reactions like skin rash, itching or hives, swelling of the face, lips, or tongue -fever, flu-like symptoms -genital sores -high blood pressure -no menstrual period for 6 weeks during use -pain, swelling, warmth in the leg -pelvic pain or tenderness -severe or sudden headache -signs of pregnancy -stomach cramping -sudden shortness of breath -trouble with balance, talking, or walking -unusual vaginal bleeding, discharge -yellowing of the eyes or skin Side effects that usually do not require medical attention (report to your doctor or health care professional if they continue or are bothersome): -acne -breast pain -change in sex drive or performance -changes in weight -cramping, dizziness, or faintness while the device is being inserted -headache -irregular menstrual bleeding within first 3 to 6 months of use -nausea This list may not describe all possible side effects. Call your doctor for medical advice about side effects. You may report side effects to FDA at 1-800-FDA-1088. Where should I keep my medicine? This does not apply. NOTE: This sheet is a summary. It may not cover all possible information. If you have questions about this medicine, talk to your doctor, pharmacist, or health care provider.  2014, Elsevier/Gold Standard. (2011-09-28 13:54:04)  

## 2013-08-13 NOTE — Progress Notes (Signed)
Subjective:     Patient ID: [name], female   DOB: 06/09/1971, 42 y.o.   MRN: [id number]  [name] [name] is a 42 y.o. [id number] here for f/u of menorrhagia.  She denies intermenstrual bleeding.  No GYN concerns.  She requests treatment.  Has sono which was normal.    Review of Systems     Objective:   Physical Exam BP 114/81  Pulse 63  Temp(Src) 98 F (36.7 C) (Oral)  Resp 20  Ht 5\' 6"  (1.676 m)  Wt 188 lb 9.6 oz (85.548 kg)  BMI 30.46 kg/m2  LMP 07/24/2013  GYNECOLOGY CLINIC PROCEDURE NOTE  IUD Insertion Procedure Note Patient identified, informed consent performed.  Discussed risks of irregular bleeding, cramping, infection, malpositioning or misplacement of the IUD outside the uterus which may require further procedures. Time out was performed.  Urine pregnancy test negative.  Speculum placed in the vagina.  Cervix visualized.  Cleaned with Betadine x 2.  Grasped anteriorly with a single tooth tenaculum.  Uterus sounded to 10 cm.  Mirena IUD placed per manufacturer's recommendations.  Strings trimmed to 3 cm. Tenaculum was removed, good hemostasis noted.  Patient tolerated procedure well.     08/05/2013 CLINICAL DATA: Abnormal uterine bleeding  EXAM:  TRANSABDOMINAL AND TRANSVAGINAL ULTRASOUND OF PELVIS  TECHNIQUE:  Both transabdominal and transvaginal ultrasound examinations of the  pelvis were performed. Transabdominal technique was performed for  global imaging of the pelvis including uterus, ovaries, adnexal  regions, and pelvic cul-de-sac. It was necessary to proceed with  endovaginal exam following the transabdominal exam to visualize the  uterus and ovaries to better advantage.  COMPARISON: None  FINDINGS:  Uterus  Measurements: 6 cm x 3.6 cm x 6.2 cm. Uterus is mildly retroverted.  No fibroids or other mass visualized.  Endometrium  Thickness: 9.6 mm. No focal abnormality visualized.  Right ovary  Measurements: 23 mm x 13 mm x 17 mm. Cyst lies  above the right ovary  measuring 2.7 cm in greatest dimension. This has a simple cyst  appearance. It could potentially be exophytic arising from the right  ovary, but appears to be separate, a paraovarian cyst.  Left ovary  Measurements: 31 mm x 21 mm x 23 mm. Similar to the right, there is  a cyst lying above the left ovary which could reflect an exophytic  ovarian cyst or a paraovarian cyst. It measures 2 cm in size.  Other findings  No free fluid.  IMPRESSION:  1. Normal uterus and endometrium. No findings to explain abnormal  uterine bleeding.  2. Simple appearing cysts lie above the each ovary and may be  exophytic ovarian cyst or paraovarian cyst.  3. No other abnormalities.       Assessment:     Menorrhagia.  D/w pt treatment options .  Pt opts for a Mirena.     Plan:     Patient was given post-procedure instructions.  Patient was also asked to check IUD strings periodically and follow up in 6-8 weeks for IUD check.

## 2013-08-13 NOTE — Addendum Note (Signed)
Addended by: Faythe Casa on: 08/13/2013 04:42 PM   Modules accepted: Orders

## 2013-08-13 NOTE — Progress Notes (Signed)
UPT:negative

## 2013-08-14 ENCOUNTER — Encounter: Payer: Self-pay | Admitting: *Deleted

## 2013-09-25 ENCOUNTER — Ambulatory Visit (INDEPENDENT_AMBULATORY_CARE_PROVIDER_SITE_OTHER): Payer: Medicare Other | Admitting: Obstetrics & Gynecology

## 2013-09-25 ENCOUNTER — Encounter: Payer: Self-pay | Admitting: Obstetrics & Gynecology

## 2013-09-25 VITALS — BP 108/67 | HR 64 | Ht 66.0 in | Wt 191.2 lb

## 2013-09-25 DIAGNOSIS — Z30431 Encounter for routine checking of intrauterine contraceptive device: Secondary | ICD-10-CM

## 2013-09-25 DIAGNOSIS — A499 Bacterial infection, unspecified: Secondary | ICD-10-CM

## 2013-09-25 DIAGNOSIS — N760 Acute vaginitis: Secondary | ICD-10-CM

## 2013-09-25 DIAGNOSIS — B9689 Other specified bacterial agents as the cause of diseases classified elsewhere: Secondary | ICD-10-CM

## 2013-09-25 MED ORDER — METRONIDAZOLE 250 MG PO TABS: 500.0000 mg | ORAL_TABLET | Freq: Two times a day (BID) | ORAL | Status: DC

## 2013-09-25 NOTE — Patient Instructions (Addendum)
Levonorgestrel intrauterine device (IUD) What is this medicine? LEVONORGESTREL IUD (LEE voe nor jes trel) is a contraceptive (birth control) device. The device is placed inside the uterus by a healthcare professional. It is used to prevent pregnancy and can also be used to treat heavy bleeding that occurs during your period. Depending on the device, it can be used for 3 to 5 years. This medicine may be used for other purposes; ask your health care provider or pharmacist if you have questions. COMMON BRAND NAME(S): Mirena, Skyla What should I tell my health care provider before I take this medicine? They need to know if you have any of these conditions: -abnormal Pap smear -cancer of the breast, uterus, or cervix -diabetes -endometritis -genital or pelvic infection now or in the past -have more than one sexual partner or your partner has more than one partner -heart disease -history of an ectopic or tubal pregnancy -immune system problems -IUD in place -liver disease or tumor -problems with blood clots or take blood-thinners -use intravenous drugs -uterus of unusual shape -vaginal bleeding that has not been explained -an unusual or allergic reaction to levonorgestrel, other hormones, silicone, or polyethylene, medicines, foods, dyes, or preservatives -pregnant or trying to get pregnant -breast-feeding How should I use this medicine? This device is placed inside the uterus by a health care professional. Talk to your pediatrician regarding the use of this medicine in children. Special care may be needed. Overdosage: If you think you have taken too much of this medicine contact a poison control center or emergency room at once. NOTE: This medicine is only for you. Do not share this medicine with others. What if I miss a dose? This does not apply. What may interact with this medicine? Do not take this medicine with any of the following  medications: -amprenavir -bosentan -fosamprenavir This medicine may also interact with the following medications: -aprepitant -barbiturate medicines for inducing sleep or treating seizures -bexarotene -griseofulvin -medicines to treat seizures like carbamazepine, ethotoin, felbamate, oxcarbazepine, phenytoin, topiramate -modafinil -pioglitazone -rifabutin -rifampin -rifapentine -some medicines to treat HIV infection like atazanavir, indinavir, lopinavir, nelfinavir, tipranavir, ritonavir -St. John's wort -warfarin This list may not describe all possible interactions. Give your health care provider a list of all the medicines, herbs, non-prescription drugs, or dietary supplements you use. Also tell them if you smoke, drink alcohol, or use illegal drugs. Some items may interact with your medicine. What should I watch for while using this medicine? Visit your doctor or health care professional for regular check ups. See your doctor if you or your partner has sexual contact with others, becomes HIV positive, or gets a sexual transmitted disease. This product does not protect you against HIV infection (AIDS) or other sexually transmitted diseases. You can check the placement of the IUD yourself by reaching up to the top of your vagina with clean fingers to feel the threads. Do not pull on the threads. It is a good habit to check placement after each menstrual period. Call your doctor right away if you feel more of the IUD than just the threads or if you cannot feel the threads at all. The IUD may come out by itself. You may become pregnant if the device comes out. If you notice that the IUD has come out use a backup birth control method like condoms and call your health care provider. Using tampons will not change the position of the IUD and are okay to use during your period. What side effects may I   notice from receiving this medicine? Side effects that you should report to your doctor or  health care professional as soon as possible: -allergic reactions like skin rash, itching or hives, swelling of the face, lips, or tongue -fever, flu-like symptoms -genital sores -high blood pressure -no menstrual period for 6 weeks during use -pain, swelling, warmth in the leg -pelvic pain or tenderness -severe or sudden headache -signs of pregnancy -stomach cramping -sudden shortness of breath -trouble with balance, talking, or walking -unusual vaginal bleeding, discharge -yellowing of the eyes or skin Side effects that usually do not require medical attention (report to your doctor or health care professional if they continue or are bothersome): -acne -breast pain -change in sex drive or performance -changes in weight -cramping, dizziness, or faintness while the device is being inserted -headache -irregular menstrual bleeding within first 3 to 6 months of use -nausea This list may not describe all possible side effects. Call your doctor for medical advice about side effects. You may report side effects to FDA at 1-800-FDA-1088. Where should I keep my medicine? This does not apply. NOTE: This sheet is a summary. It may not cover all possible information. If you have questions about this medicine, talk to your doctor, pharmacist, or health care provider.  2014, Elsevier/Gold Standard. (2011-09-28 13:54:04) Bacterial Vaginosis Bacterial vaginosis is a vaginal infection that occurs when the normal balance of bacteria in the vagina is disrupted. It results from an overgrowth of certain bacteria. This is the most common vaginal infection in women of childbearing age. Treatment is important to prevent complications, especially in pregnant women, as it can cause a premature delivery. CAUSES  Bacterial vaginosis is caused by an increase in harmful bacteria that are normally present in smaller amounts in the vagina. Several different kinds of bacteria can cause bacterial vaginosis. However,  the reason that the condition develops is not fully understood. RISK FACTORS Certain activities or behaviors can put you at an increased risk of developing bacterial vaginosis, including:  Having a new sex partner or multiple sex partners.  Douching.  Using an intrauterine device (IUD) for contraception. Women do not get bacterial vaginosis from toilet seats, bedding, swimming pools, or contact with objects around them. SIGNS AND SYMPTOMS  Some women with bacterial vaginosis have no signs or symptoms. Common symptoms include:  Grey vaginal discharge.  A fishlike odor with discharge, especially after sexual intercourse.  Itching or burning of the vagina and vulva.  Burning or pain with urination. DIAGNOSIS  Your health care provider will take a medical history and examine the vagina for signs of bacterial vaginosis. A sample of vaginal fluid may be taken. Your health care provider will look at this sample under a microscope to check for bacteria and abnormal cells. A vaginal pH test may also be done.  TREATMENT  Bacterial vaginosis may be treated with antibiotic medicines. These may be given in the form of a pill or a vaginal cream. A second round of antibiotics may be prescribed if the condition comes back after treatment.  HOME CARE INSTRUCTIONS   Only take over-the-counter or prescription medicines as directed by your health care provider.  If antibiotic medicine was prescribed, take it as directed. Make sure you finish it even if you start to feel better.  Do not have sex until treatment is completed.  Tell all sexual partners that you have a vaginal infection. They should see their health care provider and be treated if they have problems, such as a mild  rash or itching.  Practice safe sex by using condoms and only having one sex partner. SEEK MEDICAL CARE IF:   Your symptoms are not improving after 3 days of treatment.  You have increased discharge or pain.  You have a  fever. MAKE SURE YOU:   Understand these instructions.  Will watch your condition.  Will get help right away if you are not doing well or get worse. FOR MORE INFORMATION  Centers for Disease Control and Prevention, Division of STD Prevention: [employer]raiser[employer]raud[employer] American Sexual Health Association (ASHA): www.ashastd.org  Document Released: 08/28/2005 Document Revised: 06/18/2013 Document Reviewed: 04/09/2013 [facility] Patient Information 2014 [place].

## 2013-09-25 NOTE — Progress Notes (Signed)
Patient ID: [name] [name], female   DOB: 13-Oct-1970, 43 y.o.   MRN: [id number] History:  43 y.o. [id number] here today for today for IUD string check; Mirena IUD was placed 08/13/2013. No complaints about the Mirena, no concerning side effects.  The following portions of the patient's history were reviewed and updated as appropriate: allergies, current medications, past family history, past medical history, past social history, past surgical history and problem list.   Review of Systems:  Pertinent items are noted in HPI.  Objective:  Physical Exam Blood pressure 108/67, pulse 64, height 5\' 6"  (1.676 m), weight 191 lb 3.2 oz (86.728 kg). Gen: NAD Abd: Soft, nontender and nondistended Pelvic: Normal appearing external genitalia; normal appearing vaginal mucosa and cervix.  IUD strings visualized, about 3 cm in length outside cervix.   Assessment & Plan:  Normal IUD check. BV  Patient to keep IUD in place for five years; can come in for removal if she desires pregnancy within the next five years. Routine preventative health maintenance measures emphasized. Flagyl 250mg  2 po bid x 7 days F/u 3 months or sooner prn

## 2013-10-01 ENCOUNTER — Telehealth: Payer: Self-pay | Admitting: *Deleted

## 2013-10-01 DIAGNOSIS — B379 Candidiasis, unspecified: Secondary | ICD-10-CM

## 2013-10-01 MED ORDER — FLUCONAZOLE 150 MG PO TABS: 150.0000 mg | ORAL_TABLET | Freq: Once | ORAL | Status: DC

## 2013-10-01 NOTE — Telephone Encounter (Signed)
Called patient and pt states she has an yeast infection from the antibiotic and needs medication. Will order Diflucan.  Pt verbalizes understanding.

## 2013-10-01 NOTE — Telephone Encounter (Signed)
Pt called nurse line requesting a phone call back from the office, no further details given.

## 2013-11-14 ENCOUNTER — Ambulatory Visit (HOSPITAL_BASED_OUTPATIENT_CLINIC_OR_DEPARTMENT_OTHER): Payer: Medicare Other | Admitting: Hematology & Oncology

## 2013-11-14 ENCOUNTER — Other Ambulatory Visit: Payer: Medicare Other | Admitting: Lab

## 2013-11-14 ENCOUNTER — Other Ambulatory Visit: Payer: Self-pay | Admitting: Hematology & Oncology

## 2013-11-14 ENCOUNTER — Other Ambulatory Visit: Payer: Self-pay | Admitting: *Deleted

## 2013-11-14 ENCOUNTER — Encounter: Payer: Self-pay | Admitting: Hematology & Oncology

## 2013-11-14 ENCOUNTER — Ambulatory Visit (HOSPITAL_BASED_OUTPATIENT_CLINIC_OR_DEPARTMENT_OTHER)
Admission: RE | Admit: 2013-11-14 | Discharge: 2013-11-14 | Disposition: A | Discharge status: Home / Self Care | Payer: Medicare Other | Source: Ambulatory Visit | Attending: Hematology & Oncology | Admitting: Hematology & Oncology

## 2013-11-14 VITALS — BP 127/73 | HR 89 | Temp 99.8°F | Resp 16 | Ht 64.0 in | Wt 195.0 lb

## 2013-11-14 DIAGNOSIS — D509 Iron deficiency anemia, unspecified: Secondary | ICD-10-CM

## 2013-11-14 DIAGNOSIS — M069 Rheumatoid arthritis, unspecified: Secondary | ICD-10-CM | POA: Insufficient documentation

## 2013-11-14 DIAGNOSIS — R509 Fever, unspecified: Secondary | ICD-10-CM

## 2013-11-14 DIAGNOSIS — R059 Cough, unspecified: Secondary | ICD-10-CM | POA: Insufficient documentation

## 2013-11-14 DIAGNOSIS — Q788 Other specified osteochondrodysplasias: Secondary | ICD-10-CM

## 2013-11-14 DIAGNOSIS — R05 Cough: Secondary | ICD-10-CM | POA: Insufficient documentation

## 2013-11-14 LAB — CBC WITH DIFFERENTIAL (CANCER CENTER ONLY)
BASO#: 0 10*3/uL (ref 0.0–0.2)
BASO%: 0.4 % (ref 0.0–2.0)
EOS%: 2 % (ref 0.0–7.0)
Eosinophils Absolute: 0.1 10*3/uL (ref 0.0–0.5)
HCT: 34.3 % — ABNORMAL LOW (ref 34.8–46.6)
HGB: 11.4 g/dL — ABNORMAL LOW (ref 11.6–15.9)
LYMPH#: 1 10*3/uL (ref 0.9–3.3)
LYMPH%: 18.9 % (ref 14.0–48.0)
MCH: 29.7 pg (ref 26.0–34.0)
MCHC: 33.2 g/dL (ref 32.0–36.0)
MCV: 89 fL (ref 81–101)
MONO#: 0.4 10*3/uL (ref 0.1–0.9)
MONO%: 7.6 % (ref 0.0–13.0)
NEUT#: 3.8 10*3/uL (ref 1.5–6.5)
NEUT%: 71.1 % (ref 39.6–80.0)
Platelets: 267 10*3/uL (ref 145–400)
RBC: 3.84 10*6/uL (ref 3.70–5.32)
RDW: 13.2 % (ref 11.1–15.7)
WBC: 5.4 10*3/uL (ref 3.9–10.0)

## 2013-11-14 LAB — SEDIMENTATION RATE: Sed Rate: 36 mm/hr — ABNORMAL HIGH (ref 0–22)

## 2013-11-14 LAB — CHCC SATELLITE - SMEAR

## 2013-11-14 MED ORDER — CEFDINIR 300 MG PO CAPS: 300.0000 mg | ORAL_CAPSULE | Freq: Two times a day (BID) | ORAL | Status: DC

## 2013-11-14 MED ORDER — HYDROCODONE-HOMATROPINE 5-1.5 MG/5ML PO SYRP: 5.0000 mL | ORAL_SOLUTION | Freq: Four times a day (QID) | ORAL | Status: DC | PRN

## 2013-11-14 NOTE — Progress Notes (Signed)
Ms. Perno comes in for a visit. It's been 3 years since we last saw her. 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 She has a history of juvenile rheumatoid arthritis. She's been seen at [facility]. She was getting injections with Remicade. Last one was back in November. She takes methotrexate shots.  She last was seen at [facility] in January.  She was told to come here because his having a fever. She cough. She had right hip pain.  2 weeks ago she had an x-ray done at [facility]. I don't have those results. Wrap she has a temperature. Her temperature is 99.8 today. She's not coughing up anything purulent. She just feels congested.  She's had no bleeding. There's been no change in bowel or bladder habits. She's had no diarrhea.  She's had no leg swelling. There's been no rashes.  On her exam, her temperature was 99.8. Blood pressure 127/73. Pulse 89. Her lungs side with some crackles over the right side. She good air movement. Double wheezes were noted. Cardiac exam regular in rhythm. Abdomen soft. There is no palpable liver or spleen. Him exam shows some tenderness over the right lateral hip. The swelling was noted. No erythema was noted. Extremities  showed no clubbing cyanosis or edema.Marland Kitchen Lymph nodes were not palpable. Neurological exam negative.  Labs today showed a white cell count of 5.4. Hemoglobin 11.4. Platelet count 267.  A chest x-ray showed no obvious infiltrate. There are no effusions. Heart size may been a little bit enlarged.  Right hip was unremarkable.  I am not sure what is causing the cough and fever. Is possible she may have some type of bronchitis. She is somewhat immunocompromised from the methotrexate.  I think we probably need to be on some antibiotics. I will put her on Omnicef. I thought about putting her on Tamiflu. Hopefully the [facility] will help. I also gave her some cough medicine.  I told her to let us know if she has any more issues.  We  certainly can get her back as needed.  We are checking iron studies on her. She does get iron deficient which needs to be replaced.  She did not think that she needed any IV fluids today.

## 2013-11-17 ENCOUNTER — Telehealth: Payer: Self-pay | Admitting: Hematology & Oncology

## 2013-11-17 LAB — IRON AND TIBC CHCC
%SAT: 13 % — ABNORMAL LOW (ref 21–57)
Iron: 40 ug/dL — ABNORMAL LOW (ref 41–142)
TIBC: 315 ug/dL (ref 236–444)
UIBC: 275 ug/dL (ref 120–384)

## 2013-11-17 LAB — FERRITIN CHCC: Ferritin: 12 ng/ml (ref 9–269)

## 2013-11-17 NOTE — Telephone Encounter (Signed)
Pt aware of 4-22 appointment

## 2013-11-19 ENCOUNTER — Telehealth: Payer: Self-pay | Admitting: Nurse Practitioner

## 2013-11-19 ENCOUNTER — Other Ambulatory Visit: Payer: Self-pay | Admitting: Nurse Practitioner

## 2013-11-19 DIAGNOSIS — D509 Iron deficiency anemia, unspecified: Secondary | ICD-10-CM

## 2013-11-19 NOTE — Telephone Encounter (Addendum)
Message copied by Jimmy Footman on Wed Nov 19, 2013 11:16 AM ------      Message from: Burney Gauze R      Created: Mon Nov 17, 2013  5:07 PM       Call - iron is pretty low!!  Need IV Feraheme at 1020mg  x 1 dose. Please set tis up!!  Laurey Arrow ------spoke with pt and she verbalized understanding and appreciation. She will be set up for 3/25 @ 3pm.

## 2013-12-03 ENCOUNTER — Ambulatory Visit (HOSPITAL_BASED_OUTPATIENT_CLINIC_OR_DEPARTMENT_OTHER): Payer: Medicare Other

## 2013-12-03 VITALS — BP 111/80 | HR 74 | Temp 97.0°F | Resp 14

## 2013-12-03 DIAGNOSIS — D509 Iron deficiency anemia, unspecified: Secondary | ICD-10-CM

## 2013-12-03 MED ORDER — SODIUM CHLORIDE 0.9 % IV SOLN
1020.0000 mg | Freq: Once | INTRAVENOUS | Status: AC
  Administered 2013-12-03: 1020 mg via INTRAVENOUS
  Filled 2013-12-03: qty 34

## 2013-12-03 MED ORDER — CYANOCOBALAMIN 1000 MCG/ML IJ SOLN
INTRAMUSCULAR | Status: AC
  Filled 2013-12-03: qty 1

## 2013-12-03 MED ORDER — CYANOCOBALAMIN 1000 MCG/ML IJ SOLN
1000.0000 ug | Freq: Once | INTRAMUSCULAR | Status: AC
  Administered 2013-12-03: 1000 ug via INTRAMUSCULAR

## 2013-12-03 MED ORDER — SODIUM CHLORIDE 0.9 % IV SOLN
Freq: Once | INTRAVENOUS | Status: AC
  Administered 2013-12-03: 16:00:00 via INTRAVENOUS

## 2013-12-03 NOTE — Patient Instructions (Signed)

## 2013-12-25 ENCOUNTER — Encounter: Payer: Self-pay | Admitting: Obstetrics & Gynecology

## 2013-12-25 ENCOUNTER — Ambulatory Visit (INDEPENDENT_AMBULATORY_CARE_PROVIDER_SITE_OTHER): Payer: Medicare Other | Admitting: Obstetrics & Gynecology

## 2013-12-25 VITALS — BP 119/76 | HR 70 | Temp 97.5°F | Ht 66.0 in | Wt 190.4 lb

## 2013-12-25 DIAGNOSIS — N949 Unspecified condition associated with female genital organs and menstrual cycle: Secondary | ICD-10-CM

## 2013-12-25 DIAGNOSIS — N925 Other specified irregular menstruation: Secondary | ICD-10-CM

## 2013-12-25 DIAGNOSIS — N938 Other specified abnormal uterine and vaginal bleeding: Secondary | ICD-10-CM

## 2013-12-25 NOTE — Patient Instructions (Signed)
Levonorgestrel intrauterine device (IUD) What is this medicine? LEVONORGESTREL IUD (LEE voe nor jes trel) is a contraceptive (birth control) device. The device is placed inside the uterus by a healthcare professional. It is used to prevent pregnancy and can also be used to treat heavy bleeding that occurs during your period. Depending on the device, it can be used for 3 to 5 years. This medicine may be used for other purposes; ask your health care provider or pharmacist if you have questions. COMMON BRAND NAME(S): Mirena, Skyla What should I tell my health care provider before I take this medicine? They need to know if you have any of these conditions: -abnormal Pap smear -cancer of the breast, uterus, or cervix -diabetes -endometritis -genital or pelvic infection now or in the past -have more than one sexual partner or your partner has more than one partner -heart disease -history of an ectopic or tubal pregnancy -immune system problems -IUD in place -liver disease or tumor -problems with blood clots or take blood-thinners -use intravenous drugs -uterus of unusual shape -vaginal bleeding that has not been explained -an unusual or allergic reaction to levonorgestrel, other hormones, silicone, or polyethylene, medicines, foods, dyes, or preservatives -pregnant or trying to get pregnant -breast-feeding How should I use this medicine? This device is placed inside the uterus by a health care professional. Talk to your pediatrician regarding the use of this medicine in children. Special care may be needed. Overdosage: If you think you have taken too much of this medicine contact a poison control center or emergency room at once. NOTE: This medicine is only for you. Do not share this medicine with others. What if I miss a dose? This does not apply. What may interact with this medicine? Do not take this medicine with any of the following  medications: -amprenavir -bosentan -fosamprenavir This medicine may also interact with the following medications: -aprepitant -barbiturate medicines for inducing sleep or treating seizures -bexarotene -griseofulvin -medicines to treat seizures like carbamazepine, ethotoin, felbamate, oxcarbazepine, phenytoin, topiramate -modafinil -pioglitazone -rifabutin -rifampin -rifapentine -some medicines to treat HIV infection like atazanavir, indinavir, lopinavir, nelfinavir, tipranavir, ritonavir -St. John's wort -warfarin This list may not describe all possible interactions. Give your health care provider a list of all the medicines, herbs, non-prescription drugs, or dietary supplements you use. Also tell them if you smoke, drink alcohol, or use illegal drugs. Some items may interact with your medicine. What should I watch for while using this medicine? Visit your doctor or health care professional for regular check ups. See your doctor if you or your partner has sexual contact with others, becomes HIV positive, or gets a sexual transmitted disease. This product does not protect you against HIV infection (AIDS) or other sexually transmitted diseases. You can check the placement of the IUD yourself by reaching up to the top of your vagina with clean fingers to feel the threads. Do not pull on the threads. It is a good habit to check placement after each menstrual period. Call your doctor right away if you feel more of the IUD than just the threads or if you cannot feel the threads at all. The IUD may come out by itself. You may become pregnant if the device comes out. If you notice that the IUD has come out use a backup birth control method like condoms and call your health care provider. Using tampons will not change the position of the IUD and are okay to use during your period. What side effects may I   notice from receiving this medicine? Side effects that you should report to your doctor or  health care professional as soon as possible: -allergic reactions like skin rash, itching or hives, swelling of the face, lips, or tongue -fever, flu-like symptoms -genital sores -high blood pressure -no menstrual period for 6 weeks during use -pain, swelling, warmth in the leg -pelvic pain or tenderness -severe or sudden headache -signs of pregnancy -stomach cramping -sudden shortness of breath -trouble with balance, talking, or walking -unusual vaginal bleeding, discharge -yellowing of the eyes or skin Side effects that usually do not require medical attention (report to your doctor or health care professional if they continue or are bothersome): -acne -breast pain -change in sex drive or performance -changes in weight -cramping, dizziness, or faintness while the device is being inserted -headache -irregular menstrual bleeding within first 3 to 6 months of use -nausea This list may not describe all possible side effects. Call your doctor for medical advice about side effects. You may report side effects to FDA at 1-800-FDA-1088. Where should I keep my medicine? This does not apply. NOTE: This sheet is a summary. It may not cover all possible information. If you have questions about this medicine, talk to your doctor, pharmacist, or health care provider.  2014, Elsevier/Gold Standard. (2011-09-28 13:54:04)  

## 2013-12-25 NOTE — Progress Notes (Signed)
Subjective:     Patient ID: [name] [name], female   DOB: 1970-11-20, 43 y.o.   MRN: [id number]  HPI Pt s/p Mirena placement in Dec 2014.  She reports that her cycles have decreased for 10 days of heavy bleeding per month to 4 days of heavy bleeding.  She feels better but, was told by her Rheumatologist to f/u here as she is still anemic.  Pt does c/o some spotting after her cycles but, this is still much less bleeding than she previously had.   Review of Systems     Objective:   Physical Exam BP 119/76  Pulse 70  Temp(Src) 97.5 F (36.4 C) (Oral)  Ht 5\' 6"  (1.676 m)  Wt 190 lb 6.4 oz (86.365 kg)  BMI 30.75 kg/m2  LMP 11/24/2013 Pt in NAD  Exam deferred     Assessment:     menorrhaiga improved with Mirena    Plan:     Keep  Mirena F/u in 6 months- pt may f/u via telephone to give update if she desires

## 2013-12-31 ENCOUNTER — Other Ambulatory Visit (HOSPITAL_BASED_OUTPATIENT_CLINIC_OR_DEPARTMENT_OTHER): Payer: Medicare Other | Admitting: Lab

## 2013-12-31 ENCOUNTER — Encounter: Payer: Self-pay | Admitting: Hematology & Oncology

## 2013-12-31 ENCOUNTER — Ambulatory Visit (HOSPITAL_BASED_OUTPATIENT_CLINIC_OR_DEPARTMENT_OTHER): Payer: Medicare Other | Admitting: Hematology & Oncology

## 2013-12-31 VITALS — BP 115/62 | HR 68 | Temp 98.1°F | Resp 14 | Ht 66.0 in | Wt 190.0 lb

## 2013-12-31 DIAGNOSIS — M069 Rheumatoid arthritis, unspecified: Secondary | ICD-10-CM

## 2013-12-31 DIAGNOSIS — D509 Iron deficiency anemia, unspecified: Secondary | ICD-10-CM

## 2013-12-31 LAB — CBC WITH DIFFERENTIAL (CANCER CENTER ONLY)
BASO#: 0 10*3/uL (ref 0.0–0.2)
BASO%: 0.6 % (ref 0.0–2.0)
EOS%: 6.3 % (ref 0.0–7.0)
Eosinophils Absolute: 0.3 10*3/uL (ref 0.0–0.5)
HCT: 35.1 % (ref 34.8–46.6)
HGB: 11.5 g/dL — ABNORMAL LOW (ref 11.6–15.9)
LYMPH#: 1.6 10*3/uL (ref 0.9–3.3)
LYMPH%: 30.3 % (ref 14.0–48.0)
MCH: 30 pg (ref 26.0–34.0)
MCHC: 32.8 g/dL (ref 32.0–36.0)
MCV: 92 fL (ref 81–101)
MONO#: 0.4 10*3/uL (ref 0.1–0.9)
MONO%: 7.4 % (ref 0.0–13.0)
NEUT#: 2.8 10*3/uL (ref 1.5–6.5)
NEUT%: 55.4 % (ref 39.6–80.0)
Platelets: 290 10*3/uL (ref 145–400)
RBC: 3.83 10*6/uL (ref 3.70–5.32)
RDW: 14 % (ref 11.1–15.7)
WBC: 5.1 10*3/uL (ref 3.9–10.0)

## 2013-12-31 LAB — CHCC SATELLITE - SMEAR

## 2014-01-01 LAB — FERRITIN CHCC: Ferritin: 312 ng/ml — ABNORMAL HIGH (ref 9–269)

## 2014-01-01 LAB — IRON AND TIBC CHCC
%SAT: 41 % (ref 21–57)
Iron: 98 ug/dL (ref 41–142)
TIBC: 240 ug/dL (ref 236–444)
UIBC: 142 ug/dL (ref 120–384)

## 2014-01-02 LAB — COMPREHENSIVE METABOLIC PANEL
ALT: 24 U/L (ref 0–35)
AST: 20 U/L (ref 0–37)
Albumin: 4.1 g/dL (ref 3.5–5.2)
Alkaline Phosphatase: 45 U/L (ref 39–117)
BUN: 14 mg/dL (ref 6–23)
CO2: 27 mEq/L (ref 19–32)
Calcium: 9.2 mg/dL (ref 8.4–10.5)
Chloride: 103 mEq/L (ref 96–112)
Creatinine, Ser: 0.83 mg/dL (ref 0.50–1.10)
Glucose, Bld: 74 mg/dL (ref 70–99)
Potassium: 3.9 mEq/L (ref 3.5–5.3)
Sodium: 135 mEq/L (ref 135–145)
Total Bilirubin: 0.4 mg/dL (ref 0.2–1.2)
Total Protein: 6.9 g/dL (ref 6.0–8.3)

## 2014-01-02 LAB — HEMOGLOBINOPATHY EVALUATION
Hemoglobin Other: 0 %
Hgb A2 Quant: 2.8 % (ref 2.2–3.2)
Hgb A: 97.2 % (ref 96.8–97.8)
Hgb F Quant: 0 % (ref 0.0–2.0)
Hgb S Quant: 0 %

## 2014-01-02 LAB — SEDIMENTATION RATE: Sed Rate: 42 mm/hr — ABNORMAL HIGH (ref 0–22)

## 2014-01-02 LAB — LACTATE DEHYDROGENASE: LDH: 115 U/L (ref 94–250)

## 2014-01-05 NOTE — Progress Notes (Signed)
Hematology and Oncology Follow Up Visit  [name] [name] [id number] 04/24/1971 43 y.o. 01/05/2014   Principle Diagnosis:   Bone marrow fibrosis  Intermittent iron deficiency anemia  Juvenile rheumatoid arthritis  Current Therapy:    IV iron as indicated     Interim History:  Ms.  [name] is back for followup. I saw her in early March. She was not feeling well. She has some type of viral syndrome. She got through this. She feels a whole lot better now.  She goes back to [facility] I think every month or so for her injections.  She's had no nausea or vomiting. She's had no bleeding. She's had no rashes.  When we saw her in early March, her ferritin was 12 and iron saturation of 13%. We did give a her IV iron on March 25.  She's had no change in bowel or bladder habits..  Medications: Current outpatient prescriptions:cyclobenzaprine (FLEXERIL) 5 MG tablet, Take 5 mg by mouth as needed. , Disp: , Rfl: ;  ferrous sulfate 325 (65 FE) MG tablet, Take 325 mg by mouth 3 (three) times daily with meals.  , Disp: , Rfl: ;  hydrocodone-acetaminophen (LORCET-HD) 5-500 MG per capsule, Take 1 capsule by mouth every 6 (six) hours as needed.  , Disp: , Rfl:  inFLIXimab (REMICADE) 100 MG injection, Inject into the vein once a week., Disp: , Rfl: ;  medroxyPROGESTERone (PROVERA) 10 MG tablet, Take 1 tablet (10 mg total) by mouth daily., Disp: 30 tablet, Rfl: 0;  pantoprazole (PROTONIX) 40 MG tablet, Take 40 mg by mouth daily.  , Disp: , Rfl: ;  predniSONE (DELTASONE) 2.5 MG tablet, Take 2.5 mg by mouth daily.  , Disp: , Rfl:  sodium chloride 0.9 % SOLN with inFLIXimab 100 MG SOLR, Inject into the vein every 3 (three) months. , Disp: , Rfl:   Allergies:  Allergies  Allergen Reactions  . Coconut Fatty Acids     Coconut in raw form  . Aspirin     Past Medical History, Surgical history, Social history, and Family History were reviewed and updated.  Review of Systems: As above  Physical Exam:  height is 5\' 6"  (1.676 m) and weight is 190 lb (86.183 kg). Her oral temperature is 98.1 F (36.7 C). Her blood pressure is 115/62 and her pulse is 68. Her respiration is 14.   [name]. Lungs are clear. Cardiac exam regular rate and rhythm. Abdomen soft. She has good bowel sounds. There is no fluid wave. There is a palpable liver or spleen tip. Back exam no tenderness over the spine ribs or hips. Extremities shows no clubbing cyanosis or edema. Skin exam no rashes.  Lab Results  Component Value Date   WBC 5.1 12/31/2013   HGB 11.5* 12/31/2013   HCT 35.1 12/31/2013   MCV 92 12/31/2013   PLT 290 12/31/2013     Chemistry      Component Value Date/Time   NA 135 12/31/2013 1524   K 3.9 12/31/2013 1524   CL 103 12/31/2013 1524   CO2 27 12/31/2013 1524   BUN 14 12/31/2013 1524   CREATININE 0.83 12/31/2013 1524      Component Value Date/Time   CALCIUM 9.2 12/31/2013 1524   ALKPHOS 45 12/31/2013 1524   AST 20 12/31/2013 1524   ALT 24 12/31/2013 1524   BILITOT 0.4 12/31/2013 1524      Ferritin is 312. Iron saturation 41%. Iron is 98   Impression and  Plan: [name] [name] is 43 year old Afro-American female. She got an a month ago. She's doing fairly well. She is mildly anemic but does not need any iron.   We will go ahead and plan to get her back in another couple months. I don't think we need any blood work in between visits.  [name], MD 4/27/20156:35 AM

## 2014-01-16 ENCOUNTER — Ambulatory Visit (HOSPITAL_BASED_OUTPATIENT_CLINIC_OR_DEPARTMENT_OTHER)
Admission: RE | Admit: 2014-01-16 | Discharge: 2014-01-16 | Disposition: A | Discharge status: Home / Self Care | Payer: Medicare Other | Source: Ambulatory Visit | Attending: Hematology & Oncology | Admitting: Hematology & Oncology

## 2014-01-16 ENCOUNTER — Ambulatory Visit (HOSPITAL_BASED_OUTPATIENT_CLINIC_OR_DEPARTMENT_OTHER): Payer: Medicare Other

## 2014-01-16 VITALS — BP 116/70 | HR 75 | Temp 97.6°F | Resp 16

## 2014-01-16 DIAGNOSIS — T82898A Other specified complication of vascular prosthetic devices, implants and grafts, initial encounter: Secondary | ICD-10-CM | POA: Insufficient documentation

## 2014-01-16 DIAGNOSIS — J9819 Other pulmonary collapse: Secondary | ICD-10-CM | POA: Insufficient documentation

## 2014-01-16 DIAGNOSIS — D509 Iron deficiency anemia, unspecified: Secondary | ICD-10-CM

## 2014-01-16 DIAGNOSIS — Y849 Medical procedure, unspecified as the cause of abnormal reaction of the patient, or of later complication, without mention of misadventure at the time of the procedure: Secondary | ICD-10-CM | POA: Insufficient documentation

## 2014-01-16 DIAGNOSIS — J984 Other disorders of lung: Secondary | ICD-10-CM | POA: Insufficient documentation

## 2014-01-16 MED ORDER — HEPARIN SOD (PORK) LOCK FLUSH 100 UNIT/ML IV SOLN
500.0000 [IU] | Freq: Once | INTRAVENOUS | Status: DC
  Filled 2014-01-16: qty 5

## 2014-01-16 MED ORDER — SODIUM CHLORIDE 0.9 % IJ SOLN
10.0000 mL | INTRAMUSCULAR | Status: DC | PRN
  Filled 2014-01-16: qty 10

## 2014-01-16 NOTE — Progress Notes (Signed)
I saw [name] [name] in the treatment area. She came in because she is complaining of some pain at the Port-A-Cath site. She had this Port-A-Cath in now for about 4 years. She will he has never had a problem with it. She says that in is tender when she moves her right shoulder. When she stretches her right chest muscle tone she feels pain. The pain does not radiate. There is no redness.  A port was accessed. She had blood return.  Is no shortness of breath. There is no cough.  We did a chest x-ray. The port was in good position. The catheter was in the SVC.  When I looked at the port, there is no redness. There is some slight tenderness just medial to the Port-A-Cath. There is some point tenderness. Her lungs were clear. Cardiac exam regular in rhythm.  I don't see any obvious etiology for this discomfort. I don't know if there might be a muscle strain. I don't know if this might be some ligamentous issue.  I thought that maybe she tried some Motrin or Aleve. Possibly, Tylenol to help.  I told her that if this was no better on Monday to let us know and I will see if one of the radiologists can take a look at it for [place].. The port was placed at [facility].

## 2014-01-16 NOTE — Progress Notes (Signed)
1:30pm Pt phoned in earlier with complaints of pain in port area right upper chest. Denies injury.  Port site accessed by [employer]. and [employer]  Port flushes without difficulty. No pain present.  Dr. Marin Olp notified, will get chest x ray.  2:27 PM Pt downstairs for chest xray.

## 2014-01-16 NOTE — Progress Notes (Signed)
Patient called at 12 noon stating that her portacath starting hurting yesterday when she moves and very uncomfortable.  Told patient to come in so we can assess port

## 2014-01-19 ENCOUNTER — Telehealth: Payer: Self-pay | Admitting: *Deleted

## 2014-01-19 NOTE — Telephone Encounter (Signed)
Message copied by Rico Ala on Mon Jan 19, 2014 10:29 AM ------      Message from: [name]      Created: Fri Jan 16, 2014  4:38 PM       Please call and let her know that the chest x-ray is normal. Thanks. Pete ------

## 2014-01-19 NOTE — Telephone Encounter (Signed)
Called patient to let her know that her CXR was normal per dr. Marin Olp

## 2014-04-01 ENCOUNTER — Other Ambulatory Visit: Payer: Medicare Other | Admitting: Lab

## 2014-04-01 ENCOUNTER — Ambulatory Visit: Payer: Medicare Other | Admitting: Hematology & Oncology

## 2014-04-03 ENCOUNTER — Telehealth: Payer: Self-pay | Admitting: Hematology & Oncology

## 2014-04-03 NOTE — Telephone Encounter (Signed)
Pt aware of 8-6 appointment

## 2014-04-16 ENCOUNTER — Other Ambulatory Visit (HOSPITAL_BASED_OUTPATIENT_CLINIC_OR_DEPARTMENT_OTHER): Payer: Medicare Other | Admitting: Lab

## 2014-04-16 ENCOUNTER — Ambulatory Visit (HOSPITAL_BASED_OUTPATIENT_CLINIC_OR_DEPARTMENT_OTHER): Payer: Medicare Other | Admitting: Hematology & Oncology

## 2014-04-16 ENCOUNTER — Encounter: Payer: Self-pay | Admitting: Hematology & Oncology

## 2014-04-16 VITALS — BP 109/70 | HR 69 | Temp 98.4°F | Resp 14 | Ht 66.0 in | Wt 193.0 lb

## 2014-04-16 DIAGNOSIS — D7581 Myelofibrosis: Secondary | ICD-10-CM

## 2014-04-16 DIAGNOSIS — D509 Iron deficiency anemia, unspecified: Secondary | ICD-10-CM

## 2014-04-16 LAB — CBC WITH DIFFERENTIAL (CANCER CENTER ONLY)
BASO#: 0 10*3/uL (ref 0.0–0.2)
BASO%: 0.6 % (ref 0.0–2.0)
EOS%: 4.9 % (ref 0.0–7.0)
Eosinophils Absolute: 0.2 10*3/uL (ref 0.0–0.5)
HCT: 36.3 % (ref 34.8–46.6)
HGB: 12.4 g/dL (ref 11.6–15.9)
LYMPH#: 1.8 10*3/uL (ref 0.9–3.3)
LYMPH%: 36.1 % (ref 14.0–48.0)
MCH: 31.5 pg (ref 26.0–34.0)
MCHC: 34.2 g/dL (ref 32.0–36.0)
MCV: 92 fL (ref 81–101)
MONO#: 0.4 10*3/uL (ref 0.1–0.9)
MONO%: 7.1 % (ref 0.0–13.0)
NEUT#: 2.5 10*3/uL (ref 1.5–6.5)
NEUT%: 51.3 % (ref 39.6–80.0)
Platelets: 278 10*3/uL (ref 145–400)
RBC: 3.94 10*6/uL (ref 3.70–5.32)
RDW: 11.4 % (ref 11.1–15.7)
WBC: 4.9 10*3/uL (ref 3.9–10.0)

## 2014-04-16 LAB — CHCC SATELLITE - SMEAR

## 2014-04-16 LAB — RETICULOCYTES (CHCC)
ABS Retic: 48.1 10*3/uL (ref 19.0–186.0)
RBC.: 4.01 MIL/uL (ref 3.87–5.11)
Retic Ct Pct: 1.2 % (ref 0.4–2.3)

## 2014-04-17 LAB — IRON AND TIBC CHCC
%SAT: 43 % (ref 21–57)
Iron: 98 ug/dL (ref 41–142)
TIBC: 230 ug/dL — ABNORMAL LOW (ref 236–444)
UIBC: 132 ug/dL (ref 120–384)

## 2014-04-17 LAB — FERRITIN CHCC: Ferritin: 130 ng/ml (ref 9–269)

## 2014-04-17 NOTE — Progress Notes (Signed)
Hematology and Oncology Follow Up Visit  [name] [name] [id number] 07/13/1971 43 y.o. 04/17/2014   Principle Diagnosis:   Bone marrow fibrosis  Intermittent iron deficiency anemia  Juvenile rheumatoid arthritis  Current Therapy:    IV iron as indicated         Interim History:  Ms.  [name] is back for followup. She looks great. She's had no problems this summer. She's working. Her room poor arthritis has not been "acting up". She is on the Remicade. This done weekly. There she's had no problems fevers. There's been no cough. She's had no change in bowel or bladder habits. Patient is complaining of some pain in her knees. This mostly with walking and going up and down stairs. She says there is no swelling. There is no warmth. She would is out of anything for this. I will reduce x-rays. She wants to hold off on this for right now.  We last saw her hip, her ferritin was 312 and iron saturation of 41%. We last gave her  IV iron back in March. Medications: Current outpatient prescriptions:ferrous sulfate 325 (65 FE) MG tablet, Take 325 mg by mouth 3 (three) times daily with meals.  , Disp: , Rfl: ;  hydrocodone-acetaminophen (LORCET-HD) 5-500 MG per capsule, Take 1 capsule by mouth every 6 (six) hours as needed.  , Disp: , Rfl: ;  inFLIXimab (REMICADE) 100 MG injection, Inject into the vein once a week., Disp: , Rfl:  medroxyPROGESTERone (PROVERA) 10 MG tablet, Take 1 tablet (10 mg total) by mouth daily., Disp: 30 tablet, Rfl: 0;  pantoprazole (PROTONIX) 40 MG tablet, Take 40 mg by mouth daily.  , Disp: , Rfl: ;  predniSONE (DELTASONE) 2.5 MG tablet, Take 2.5 mg by mouth daily.  , Disp: , Rfl: ;  sodium chloride 0.9 % SOLN with inFLIXimab 100 MG SOLR, Inject into the vein every 6 (six) months. , Disp: , Rfl:  cyclobenzaprine (FLEXERIL) 5 MG tablet, Take 5 mg by mouth as needed. , Disp: , Rfl:   Allergies:  Allergies  Allergen Reactions  . Coconut Fatty Acids     Coconut in raw form  .  Aspirin     Past Medical History, Surgical history, Social history, and Family History were reviewed and updated.  Review of Systems: As above  Physical Exam:  height is 5\' 6"  (1.676 m) and weight is 193 lb (87.544 kg). Her oral temperature is 98.4 F (36.9 C). Her blood pressure is 109/70 and her pulse is 69. Her respiration is 14.   Well-developed and well-nourished Afro-American female. Head and exam shows nausea or oral lesions. She has no palpable cervical or supraclavicular lymph nodes. Lungs are clear bilaterally. Cardiac exam regular in rhythm with no murmurs rubs or bruits. Abdomen is soft. His good bowel sounds. There is no palpable abdominal mass. There is no palpable liver or spleen tip. Back exam shows no tenderness over the spine ribs or hips. Extremities shows no clubbing cyanosis or edema. She has no swelling with her knees. His good range of motion of her knees. No clicking or crepitus changes are noted with knee range of motion. Skin exam shows no rashes, ecchymosis or petechia. Neurological exam is nonfocal. Lab Results  Component Value Date   WBC 4.9 04/16/2014   HGB 12.4 04/16/2014   HCT 36.3 04/16/2014   MCV 92 04/16/2014   PLT 278 04/16/2014     Chemistry      Component Value Date/Time   NA 135  12/31/2013 1524   K 3.9 12/31/2013 1524   CL 103 12/31/2013 1524   CO2 27 12/31/2013 1524   BUN 14 12/31/2013 1524   CREATININE 0.83 12/31/2013 1524      Component Value Date/Time   CALCIUM 9.2 12/31/2013 1524   ALKPHOS 45 12/31/2013 1524   AST 20 12/31/2013 1524   ALT 24 12/31/2013 1524   BILITOT 0.4 12/31/2013 1524         Impression and Plan: [name] [name] is 43 year old after metric very well. She has a bone marrow fibrosis. She does not have a underlying myelo fibrosis. The bone marrow fibrosis appears to be from the history of rheumatoid arthritis.  I would not think that she would need any iron. Her hemoglobin is perfect.  She wants to have her Port-A-Cath out. This was  put in at. I told her that she would have to go back to do to have it removed.  I think we can move her appointments out now. I that we probably can her back in 4 months.  She can always come back to see [place] in between and have lab work done if necessary.   [name], MD 8/7/20157:25 AM

## 2014-04-21 ENCOUNTER — Telehealth: Payer: Self-pay | Admitting: *Deleted

## 2014-04-21 NOTE — Telephone Encounter (Addendum)
Message copied by Lenn Sink on Tue Apr 21, 2014 11:17 AM ------      Message from: [name]      Created: Fri Apr 17, 2014  6:22 PM       Call - iron is ok!!! pete ------Informed pt that iron is okay.

## 2014-05-07 ENCOUNTER — Other Ambulatory Visit: Payer: Self-pay | Admitting: Hematology & Oncology

## 2014-05-07 DIAGNOSIS — M25561 Pain in right knee: Secondary | ICD-10-CM

## 2014-05-07 DIAGNOSIS — M25562 Pain in left knee: Secondary | ICD-10-CM

## 2014-05-08 ENCOUNTER — Telehealth: Payer: Self-pay | Admitting: Hematology & Oncology

## 2014-05-08 NOTE — Telephone Encounter (Signed)
Left pt message to call and schedule scan

## 2014-05-19 ENCOUNTER — Telehealth: Payer: Self-pay | Admitting: *Deleted

## 2014-05-19 NOTE — Telephone Encounter (Signed)
Called patient and informed her we do not have any earlier open appointments than her scheduled appointment on 06/22/14. Advised that she call frequently to see if there have been any cancellations. Patient verbalized understanding and gratitude. No further questions or concerns.

## 2014-05-19 NOTE — Telephone Encounter (Signed)
Patient called and stated that she would like a sooner appointment to be scheduled for her because she is having irregular cycles and can't wait until 06/22/14.

## 2014-05-23 ENCOUNTER — Ambulatory Visit (HOSPITAL_BASED_OUTPATIENT_CLINIC_OR_DEPARTMENT_OTHER)
Admission: RE | Admit: 2014-05-23 | Discharge: 2014-05-23 | Disposition: A | Discharge status: Home / Self Care | Payer: Medicare Other | Source: Ambulatory Visit | Attending: Hematology & Oncology | Admitting: Hematology & Oncology

## 2014-05-23 DIAGNOSIS — D7581 Myelofibrosis: Secondary | ICD-10-CM | POA: Diagnosis not present

## 2014-05-23 DIAGNOSIS — M25561 Pain in right knee: Secondary | ICD-10-CM

## 2014-05-23 DIAGNOSIS — M2240 Chondromalacia patellae, unspecified knee: Secondary | ICD-10-CM | POA: Diagnosis not present

## 2014-05-23 DIAGNOSIS — M25562 Pain in left knee: Secondary | ICD-10-CM

## 2014-05-23 DIAGNOSIS — M25569 Pain in unspecified knee: Secondary | ICD-10-CM | POA: Insufficient documentation

## 2014-05-23 MED ORDER — GADOBENATE DIMEGLUMINE 529 MG/ML IV SOLN
18.0000 mL | Freq: Once | INTRAVENOUS | Status: AC | PRN
Start: 2014-05-23 — End: 2014-05-23
  Administered 2014-05-23: 18 mL via INTRAVENOUS

## 2014-05-26 ENCOUNTER — Telehealth: Payer: Self-pay | Admitting: *Deleted

## 2014-05-26 NOTE — Telephone Encounter (Signed)
Message copied by Rico Ala on Tue May 26, 2014 10:32 AM ------      Message from: [name]      Created: Tue May 26, 2014  7:39 AM       Call - MRI shows some weakness of the patella (knee cap).  This has nothing to do with blood disorder.  She probably needs to see orthopedic surgery if this pain persists.  Pete ------

## 2014-06-22 ENCOUNTER — Ambulatory Visit (INDEPENDENT_AMBULATORY_CARE_PROVIDER_SITE_OTHER): Payer: Medicare Other | Admitting: Obstetrics & Gynecology

## 2014-06-22 ENCOUNTER — Telehealth: Payer: Self-pay | Admitting: Obstetrics & Gynecology

## 2014-06-22 ENCOUNTER — Encounter: Payer: Self-pay | Admitting: Obstetrics & Gynecology

## 2014-06-22 VITALS — BP 110/71 | HR 69 | Temp 98.8°F | Ht 66.0 in | Wt 194.6 lb

## 2014-06-22 DIAGNOSIS — N938 Other specified abnormal uterine and vaginal bleeding: Secondary | ICD-10-CM

## 2014-06-22 NOTE — Progress Notes (Signed)
Subjective:     Patient ID: CALEN POSCH, female   DOB: 01-Dec-1970, 43 y.o.   MRN: [id number]  HPI Pt report continued bleeding issues. She had a Mirena IUD placed in Dec and started having bleeding in August . The bleeding the 5-8th of August was heavy like a normal cycle.  Aug 19 and 20 was spotting. Aug 29th- 8th of Sept was a prolonged cycle with intermittently heavy bleeding and spotting.  Bleeding returned Oct 5th to present- currently light. Pt reports that in general her bleeding is improved from PRIOR to the Mirena.     Review of Systems     Objective:   Physical Exam BP 110/71  Pulse 69  Temp(Src) 98.8 F (37.1 C)  Ht 5\' 6"  (1.676 m)  Wt 194 lb 9.6 oz (88.27 kg)  BMI 31.42 kg/m2  LMP 06/15/2014 Pt in NAD Exam deferred      Assessment:     DUB improved with Mirena but, still not ideal.  Reviewed with pt further tc options including Megace, OCP's, endometrial ablation and LAVH.  Pt reports that she does not want to opt for further tx at present.   Pt on several meds that are hepatically excreted and I rec that we check to make sure that her LFT's are normal   Plan:     CMP today F/u in 3 months or sooner prn

## 2014-06-22 NOTE — Patient Instructions (Signed)
Endometrial Ablation Endometrial ablation removes the lining of the uterus (endometrium). It is usually a same-day, outpatient treatment. Ablation helps avoid major surgery, such as surgery to remove the cervix and uterus (hysterectomy). After endometrial ablation, you will have little or no menstrual bleeding and may not be able to have children. However, if you are premenopausal, you will need to use a reliable method of birth control following the procedure because of the small chance that pregnancy can occur. There are different reasons to have this procedure, which include:  Heavy periods.  Bleeding that is causing anemia.  Irregular bleeding.  Bleeding fibroids on the lining inside the uterus if they are smaller than 3 centimeters. This procedure should not be done if:  You want children in the future.  You have severe cramps with your menstrual period.  You have precancerous or cancerous cells in your uterus.  You were recently pregnant.  You have gone through menopause.  You have had major surgery on the uterus, such as a cesarean delivery. LET YOUR HEALTH CARE PROVIDER KNOW ABOUT:  Any allergies you have.  All medicines you are taking, including vitamins, herbs, eye drops, creams, and over-the-counter medicines.  Previous problems you or members of your family have had with the use of anesthetics.  Any blood disorders you have.  Previous surgeries you have had.  Medical conditions you have. RISKS AND COMPLICATIONS  Generally, this is a safe procedure. However, as with any procedure, complications can occur. Possible complications include:  Perforation of the uterus.  Bleeding.  Infection of the uterus, bladder, or vagina.  Injury to surrounding organs.  An air bubble to the lung (air embolus).  Pregnancy following the procedure.  Failure of the procedure to help the problem, requiring hysterectomy.  Decreased ability to diagnose cancer in the lining of  the uterus. BEFORE THE PROCEDURE  The lining of the uterus must be tested to make sure there is no pre-cancerous or cancer cells present.  An ultrasound may be performed to look at the size of the uterus and to check for abnormalities.  Medicines may be given to thin the lining of the uterus. PROCEDURE  During the procedure, your health care provider will use a tool called a resectoscope to help see inside your uterus. There are different ways to remove the lining of your uterus.   Radiofrequency - This method uses a radiofrequency-alternating electric current to remove the lining of the uterus.  Cryotherapy - This method uses extreme cold to freeze the lining of the uterus.  Heated-Free Liquid - This method uses heated salt (saline) solution to remove the lining of the uterus.  Microwave - This method uses high-energy microwaves to heat up the lining of the uterus to remove it.  Thermal balloon - This method involves inserting a catheter with a balloon tip into the uterus. The balloon tip is filled with heated fluid to remove the lining of the uterus. AFTER THE PROCEDURE  After your procedure, do not have sexual intercourse or insert anything into your vagina until permitted by your health care provider. After the procedure, you may experience:  Cramps.  Vaginal discharge.  Frequent urination. Document Released: 07/07/2004 Document Revised: 04/30/2013 Document Reviewed: 01/29/2013 ExitCare Patient Information 2015 [employer]. This information is not intended to replace advice given to you by your health care provider. Make sure you discuss any questions you have with your health care provider.  

## 2014-06-22 NOTE — Telephone Encounter (Signed)
Pt left without having blood drawn (accidentally).  She reports that the nurse also just called her and she will f/u tomorrow to have labs drawn.  Jaelin Devincentis L. Harraway-Smith, M.D., Cherlynn June

## 2014-06-23 ENCOUNTER — Ambulatory Visit: Payer: Self-pay | Admitting: General Surgery

## 2014-06-23 ENCOUNTER — Encounter (INDEPENDENT_AMBULATORY_CARE_PROVIDER_SITE_OTHER): Payer: Medicare Other | Admitting: General Surgery

## 2014-06-23 LAB — COMPREHENSIVE METABOLIC PANEL WITH GFR
ALT: 16 U/L (ref 0–35)
AST: 16 U/L (ref 0–37)
Albumin: 4.2 g/dL (ref 3.5–5.2)
Alkaline Phosphatase: 38 U/L — ABNORMAL LOW (ref 39–117)
BUN: 16 mg/dL (ref 6–23)
CO2: 28 meq/L (ref 19–32)
Calcium: 9.6 mg/dL (ref 8.4–10.5)
Chloride: 105 meq/L (ref 96–112)
Creat: 0.77 mg/dL (ref 0.50–1.10)
Glucose, Bld: 83 mg/dL (ref 70–99)
Potassium: 4.6 meq/L (ref 3.5–5.3)
Sodium: 139 meq/L (ref 135–145)
Total Bilirubin: 0.5 mg/dL (ref 0.2–1.2)
Total Protein: 6.8 g/dL (ref 6.0–8.3)

## 2014-06-25 ENCOUNTER — Encounter (HOSPITAL_BASED_OUTPATIENT_CLINIC_OR_DEPARTMENT_OTHER)
Admission: RE | Admit: 2014-06-25 | Discharge: 2014-06-25 | Disposition: A | Discharge status: Home / Self Care | Payer: Medicare Other | Source: Ambulatory Visit | Attending: General Surgery | Admitting: General Surgery

## 2014-06-25 ENCOUNTER — Encounter (HOSPITAL_BASED_OUTPATIENT_CLINIC_OR_DEPARTMENT_OTHER): Payer: Self-pay | Admitting: *Deleted

## 2014-06-25 DIAGNOSIS — Z9102 Food additives allergy status: Secondary | ICD-10-CM | POA: Diagnosis not present

## 2014-06-25 DIAGNOSIS — G43909 Migraine, unspecified, not intractable, without status migrainosus: Secondary | ICD-10-CM | POA: Diagnosis not present

## 2014-06-25 DIAGNOSIS — Z886 Allergy status to analgesic agent status: Secondary | ICD-10-CM | POA: Diagnosis not present

## 2014-06-25 DIAGNOSIS — M1990 Unspecified osteoarthritis, unspecified site: Secondary | ICD-10-CM | POA: Diagnosis not present

## 2014-06-25 DIAGNOSIS — Z452 Encounter for adjustment and management of vascular access device: Secondary | ICD-10-CM | POA: Diagnosis not present

## 2014-06-25 DIAGNOSIS — R229 Localized swelling, mass and lump, unspecified: Secondary | ICD-10-CM | POA: Diagnosis present

## 2014-06-25 DIAGNOSIS — D1779 Benign lipomatous neoplasm of other sites: Secondary | ICD-10-CM | POA: Diagnosis not present

## 2014-06-25 LAB — CBC WITH DIFFERENTIAL/PLATELET
Basophils Absolute: 0 10*3/uL (ref 0.0–0.1)
Basophils Relative: 1 % (ref 0–1)
Eosinophils Absolute: 0.2 10*3/uL (ref 0.0–0.7)
Eosinophils Relative: 5 % (ref 0–5)
HCT: 38.9 % (ref 36.0–46.0)
Hemoglobin: 12.9 g/dL (ref 12.0–15.0)
Lymphocytes Relative: 41 % (ref 12–46)
Lymphs Abs: 1.6 10*3/uL (ref 0.7–4.0)
MCH: 30.5 pg (ref 26.0–34.0)
MCHC: 33.2 g/dL (ref 30.0–36.0)
MCV: 92 fL (ref 78.0–100.0)
Monocytes Absolute: 0.2 10*3/uL (ref 0.1–1.0)
Monocytes Relative: 6 % (ref 3–12)
Neutro Abs: 1.8 10*3/uL (ref 1.7–7.7)
Neutrophils Relative %: 47 % (ref 43–77)
Platelets: 296 10*3/uL (ref 150–400)
RBC: 4.23 MIL/uL (ref 3.87–5.11)
RDW: 11.9 % (ref 11.5–15.5)
WBC: 3.8 10*3/uL — ABNORMAL LOW (ref 4.0–10.5)

## 2014-06-25 LAB — COMPREHENSIVE METABOLIC PANEL
ALT: 15 U/L (ref 0–35)
AST: 15 U/L (ref 0–37)
Albumin: 4.1 g/dL (ref 3.5–5.2)
Alkaline Phosphatase: 44 U/L (ref 39–117)
Anion gap: 10 (ref 5–15)
BUN: 12 mg/dL (ref 6–23)
CO2: 27 mEq/L (ref 19–32)
Calcium: 9.5 mg/dL (ref 8.4–10.5)
Chloride: 103 mEq/L (ref 96–112)
Creatinine, Ser: 0.97 mg/dL (ref 0.50–1.10)
GFR calc Af Amer: 82 mL/min — ABNORMAL LOW (ref >90)
GFR calc non Af Amer: 71 mL/min — ABNORMAL LOW (ref >90)
Glucose, Bld: 78 mg/dL (ref 70–99)
Potassium: 4.6 mEq/L (ref 3.7–5.3)
Sodium: 140 mEq/L (ref 137–147)
Total Bilirubin: 0.6 mg/dL (ref 0.3–1.2)
Total Protein: 7.7 g/dL (ref 6.0–8.3)

## 2014-06-25 NOTE — Progress Notes (Signed)
To come in for new labs barely over 30days Had cxr 5/15-no resp chg ekg

## 2014-06-26 ENCOUNTER — Encounter (HOSPITAL_BASED_OUTPATIENT_CLINIC_OR_DEPARTMENT_OTHER)
Admission: RE | Disposition: A | Discharge status: Home / Self Care | Payer: Self-pay | Source: Ambulatory Visit | Attending: General Surgery

## 2014-06-26 ENCOUNTER — Encounter (HOSPITAL_BASED_OUTPATIENT_CLINIC_OR_DEPARTMENT_OTHER): Payer: Self-pay | Admitting: Certified Registered"

## 2014-06-26 ENCOUNTER — Encounter (HOSPITAL_BASED_OUTPATIENT_CLINIC_OR_DEPARTMENT_OTHER): Payer: Medicare Other | Admitting: Certified Registered"

## 2014-06-26 ENCOUNTER — Ambulatory Visit (HOSPITAL_BASED_OUTPATIENT_CLINIC_OR_DEPARTMENT_OTHER)
Admission: RE | Admit: 2014-06-26 | Discharge: 2014-06-26 | Disposition: A | Discharge status: Home / Self Care | Payer: Medicare Other | Source: Ambulatory Visit | Attending: General Surgery | Admitting: General Surgery

## 2014-06-26 ENCOUNTER — Ambulatory Visit (HOSPITAL_BASED_OUTPATIENT_CLINIC_OR_DEPARTMENT_OTHER): Payer: Medicare Other | Admitting: Certified Registered"

## 2014-06-26 DIAGNOSIS — D1720 Benign lipomatous neoplasm of skin and subcutaneous tissue of unspecified limb: Secondary | ICD-10-CM

## 2014-06-26 DIAGNOSIS — D1779 Benign lipomatous neoplasm of other sites: Secondary | ICD-10-CM | POA: Insufficient documentation

## 2014-06-26 DIAGNOSIS — M1990 Unspecified osteoarthritis, unspecified site: Secondary | ICD-10-CM | POA: Diagnosis not present

## 2014-06-26 DIAGNOSIS — Z452 Encounter for adjustment and management of vascular access device: Secondary | ICD-10-CM | POA: Diagnosis not present

## 2014-06-26 DIAGNOSIS — G43909 Migraine, unspecified, not intractable, without status migrainosus: Secondary | ICD-10-CM | POA: Insufficient documentation

## 2014-06-26 DIAGNOSIS — Z9102 Food additives allergy status: Secondary | ICD-10-CM | POA: Insufficient documentation

## 2014-06-26 DIAGNOSIS — Z886 Allergy status to analgesic agent status: Secondary | ICD-10-CM | POA: Insufficient documentation

## 2014-06-26 DIAGNOSIS — Z95828 Presence of other vascular implants and grafts: Secondary | ICD-10-CM

## 2014-06-26 HISTORY — PX: PORT-A-CATH REMOVAL: SHX5289

## 2014-06-26 HISTORY — PX: LIPOMA EXCISION: SHX5283

## 2014-06-26 SURGERY — EXCISION LIPOMA
Anesthesia: Monitor Anesthesia Care | Site: Chest | Laterality: Right

## 2014-06-26 MED ORDER — ONDANSETRON HCL 4 MG/2ML IJ SOLN
INTRAMUSCULAR | Status: DC | PRN
  Administered 2014-06-26: 4 mg via INTRAVENOUS

## 2014-06-26 MED ORDER — MIDAZOLAM HCL 2 MG/2ML IJ SOLN: 1.0000 mg | INTRAMUSCULAR | Status: DC | PRN

## 2014-06-26 MED ORDER — LIDOCAINE HCL (PF) 1 % IJ SOLN
INTRAMUSCULAR | Status: AC
  Filled 2014-06-26: qty 30

## 2014-06-26 MED ORDER — CEFAZOLIN SODIUM-DEXTROSE 2-3 GM-% IV SOLR
INTRAVENOUS | Status: AC
  Filled 2014-06-26: qty 50

## 2014-06-26 MED ORDER — MIDAZOLAM HCL 2 MG/2ML IJ SOLN
INTRAMUSCULAR | Status: AC
  Filled 2014-06-26: qty 2

## 2014-06-26 MED ORDER — HYDROCODONE-ACETAMINOPHEN 5-325 MG PO TABS: 1.0000 | ORAL_TABLET | ORAL | Status: DC | PRN

## 2014-06-26 MED ORDER — POVIDONE-IODINE 10 % EX OINT
TOPICAL_OINTMENT | CUTANEOUS | Status: DC | PRN
  Administered 2014-06-26: 1 via TOPICAL

## 2014-06-26 MED ORDER — OXYCODONE HCL 5 MG PO TABS
5.0000 mg | ORAL_TABLET | Freq: Once | ORAL | Status: AC | PRN
  Administered 2014-06-26: 5 mg via ORAL

## 2014-06-26 MED ORDER — FENTANYL CITRATE 0.05 MG/ML IJ SOLN
INTRAMUSCULAR | Status: AC
  Filled 2014-06-26: qty 4

## 2014-06-26 MED ORDER — LIDOCAINE HCL (CARDIAC) 20 MG/ML IV SOLN
INTRAVENOUS | Status: DC | PRN
  Administered 2014-06-26: 60 mg via INTRAVENOUS

## 2014-06-26 MED ORDER — MIDAZOLAM HCL 5 MG/5ML IJ SOLN
INTRAMUSCULAR | Status: DC | PRN
  Administered 2014-06-26: 2 mg via INTRAVENOUS

## 2014-06-26 MED ORDER — CEFAZOLIN SODIUM-DEXTROSE 2-3 GM-% IV SOLR
2.0000 g | INTRAVENOUS | Status: AC
  Administered 2014-06-26: 2 g via INTRAVENOUS

## 2014-06-26 MED ORDER — CHLORHEXIDINE GLUCONATE 4 % EX LIQD: 1.0000 "application " | Freq: Once | CUTANEOUS | Status: DC

## 2014-06-26 MED ORDER — BUPIVACAINE-EPINEPHRINE (PF) 0.25% -1:200000 IJ SOLN
INTRAMUSCULAR | Status: AC
  Filled 2014-06-26: qty 30

## 2014-06-26 MED ORDER — FENTANYL CITRATE 0.05 MG/ML IJ SOLN
INTRAMUSCULAR | Status: DC | PRN
  Administered 2014-06-26 (×4): 50 ug via INTRAVENOUS

## 2014-06-26 MED ORDER — OXYCODONE HCL 5 MG PO TABS
ORAL_TABLET | ORAL | Status: AC
  Filled 2014-06-26: qty 1

## 2014-06-26 MED ORDER — PROPOFOL INFUSION 10 MG/ML OPTIME
INTRAVENOUS | Status: DC | PRN
  Administered 2014-06-26: 100 ug/kg/min via INTRAVENOUS

## 2014-06-26 MED ORDER — SODIUM BICARBONATE 4 % IV SOLN
INTRAVENOUS | Status: AC
  Filled 2014-06-26: qty 5

## 2014-06-26 MED ORDER — HYDROMORPHONE HCL 1 MG/ML IJ SOLN
0.2500 mg | INTRAMUSCULAR | Status: DC | PRN
  Administered 2014-06-26: 0.5 mg via INTRAVENOUS

## 2014-06-26 MED ORDER — PROMETHAZINE HCL 25 MG/ML IJ SOLN: 6.2500 mg | INTRAMUSCULAR | Status: DC | PRN

## 2014-06-26 MED ORDER — OXYCODONE HCL 5 MG/5ML PO SOLN: 5.0000 mg | Freq: Once | ORAL | Status: AC | PRN

## 2014-06-26 MED ORDER — BUPIVACAINE-EPINEPHRINE (PF) 0.5% -1:200000 IJ SOLN
INTRAMUSCULAR | Status: AC
  Filled 2014-06-26: qty 30

## 2014-06-26 MED ORDER — LACTATED RINGERS IV SOLN
INTRAVENOUS | Status: DC
  Administered 2014-06-26 (×2): via INTRAVENOUS

## 2014-06-26 MED ORDER — LIDOCAINE HCL 1 % IJ SOLN
INTRAMUSCULAR | Status: DC | PRN
  Administered 2014-06-26: 10:00:00

## 2014-06-26 MED ORDER — FENTANYL CITRATE 0.05 MG/ML IJ SOLN: 50.0000 ug | INTRAMUSCULAR | Status: DC | PRN

## 2014-06-26 MED ORDER — HYDROMORPHONE HCL 1 MG/ML IJ SOLN
INTRAMUSCULAR | Status: AC
  Filled 2014-06-26: qty 1

## 2014-06-26 MED ORDER — POVIDONE-IODINE 10 % EX OINT
TOPICAL_OINTMENT | CUTANEOUS | Status: AC
  Filled 2014-06-26: qty 28.35

## 2014-06-26 SURGICAL SUPPLY — 68 items
BENZOIN TINCTURE PRP APPL 2/3 (GAUZE/BANDAGES/DRESSINGS) IMPLANT
BLADE CLIPPER SURG (BLADE) IMPLANT
BLADE SURG 10 STRL SS (BLADE) IMPLANT
BLADE SURG 15 STRL LF DISP TIS (BLADE) ×2 IMPLANT
BLADE SURG 15 STRL SS (BLADE) ×1
BNDG COHESIVE 4X5 WHT NS (GAUZE/BANDAGES/DRESSINGS) IMPLANT
BNDG GAUZE ELAST 4 BULKY (GAUZE/BANDAGES/DRESSINGS) IMPLANT
CANISTER SUCT 1200ML W/VALVE (MISCELLANEOUS) IMPLANT
CHLORAPREP W/TINT 26ML (MISCELLANEOUS) ×3 IMPLANT
CLEANER CAUTERY TIP 5X5 PAD (MISCELLANEOUS) ×2 IMPLANT
CLIP TI MEDIUM 6 (CLIP) IMPLANT
COVER BACK TABLE 60X90IN (DRAPES) ×3 IMPLANT
COVER MAYO STAND STRL (DRAPES) ×3 IMPLANT
DECANTER SPIKE VIAL GLASS SM (MISCELLANEOUS) IMPLANT
DERMABOND ADVANCED (GAUZE/BANDAGES/DRESSINGS) ×1
DERMABOND ADVANCED .7 DNX12 (GAUZE/BANDAGES/DRESSINGS) ×2 IMPLANT
DEVICE DUBIN W/COMP PLATE 8390 (MISCELLANEOUS) IMPLANT
DRAPE LAPAROTOMY T 102X78X121 (DRAPES) IMPLANT
DRAPE PED LAPAROTOMY (DRAPES) ×3 IMPLANT
DRAPE UTILITY XL STRL (DRAPES) ×3 IMPLANT
DRSG TEGADERM 2-3/8X2-3/4 SM (GAUZE/BANDAGES/DRESSINGS) ×3 IMPLANT
DRSG TEGADERM 4X4.75 (GAUZE/BANDAGES/DRESSINGS) IMPLANT
ELECT REM PT RETURN 9FT ADLT (ELECTROSURGICAL) ×3
ELECTRODE REM PT RTRN 9FT ADLT (ELECTROSURGICAL) ×2 IMPLANT
FILTER EVAC SMOKE SURGIMEDICS (MISCELLANEOUS) IMPLANT
GLOVE BIOGEL PI IND STRL 8 (GLOVE) ×2 IMPLANT
GLOVE BIOGEL PI IND STRL 8.5 (GLOVE) ×2 IMPLANT
GLOVE BIOGEL PI INDICATOR 8 (GLOVE) ×1
GLOVE BIOGEL PI INDICATOR 8.5 (GLOVE) ×1
GLOVE ECLIPSE 7.5 STRL STRAW (GLOVE) ×6 IMPLANT
GLOVE EXAM NITRILE EXT CUFF MD (GLOVE) ×3 IMPLANT
GLOVE SURG SS PI 8.5 STRL IVOR (GLOVE) ×1
GLOVE SURG SS PI 8.5 STRL STRW (GLOVE) ×2 IMPLANT
GOWN STRL REUS W/ TWL LRG LVL3 (GOWN DISPOSABLE) ×2 IMPLANT
GOWN STRL REUS W/ TWL XL LVL3 (GOWN DISPOSABLE) ×2 IMPLANT
GOWN STRL REUS W/TWL LRG LVL3 (GOWN DISPOSABLE) ×1
GOWN STRL REUS W/TWL XL LVL3 (GOWN DISPOSABLE) ×2
NEEDLE 27GAX1X1/2 (NEEDLE) IMPLANT
NEEDLE HYPO 22GX1.5 SAFETY (NEEDLE) IMPLANT
NEEDLE HYPO 25X1 1.5 SAFETY (NEEDLE) ×3 IMPLANT
NS IRRIG 1000ML POUR BTL (IV SOLUTION) ×3 IMPLANT
PACK BASIN DAY SURGERY FS (CUSTOM PROCEDURE TRAY) ×3 IMPLANT
PAD CLEANER CAUTERY TIP 5X5 (MISCELLANEOUS) ×1
PENCIL BUTTON HOLSTER BLD 10FT (ELECTRODE) ×3 IMPLANT
SHEET MEDIUM DRAPE 40X70 STRL (DRAPES) IMPLANT
SLEEVE SCD COMPRESS KNEE MED (MISCELLANEOUS) IMPLANT
SPONGE LAP 4X18 X RAY DECT (DISPOSABLE) ×3 IMPLANT
STRIP CLOSURE SKIN 1/2X4 (GAUZE/BANDAGES/DRESSINGS) IMPLANT
STRIP CLOSURE SKIN 1/4X4 (GAUZE/BANDAGES/DRESSINGS) ×3 IMPLANT
SUCTION FRAZIER TIP 10 FR DISP (SUCTIONS) IMPLANT
SUT ETHILON 4 0 PS 2 18 (SUTURE) ×6 IMPLANT
SUT MNCRL AB 4-0 PS2 18 (SUTURE) ×3 IMPLANT
SUT MON AB 4-0 PC3 18 (SUTURE) ×3 IMPLANT
SUT PROLENE 4 0 PS 2 18 (SUTURE) IMPLANT
SUT VIC AB 2-0 SH 27 (SUTURE) ×4
SUT VIC AB 2-0 SH 27XBRD (SUTURE) ×4 IMPLANT
SUT VIC AB 3-0 54X BRD REEL (SUTURE) IMPLANT
SUT VIC AB 3-0 BRD 54 (SUTURE)
SUT VIC AB 3-0 SH 27 (SUTURE) ×2
SUT VIC AB 3-0 SH 27X BRD (SUTURE) ×2 IMPLANT
SUT VIC AB 4-0 SH 27 (SUTURE)
SUT VIC AB 4-0 SH 27XANBCTRL (SUTURE) IMPLANT
SYR BULB 3OZ (MISCELLANEOUS) IMPLANT
SYR CONTROL 10ML LL (SYRINGE) ×3 IMPLANT
TOWEL OR 17X24 6PK STRL BLUE (TOWEL DISPOSABLE) ×3 IMPLANT
TOWEL OR NON WOVEN STRL DISP B (DISPOSABLE) ×3 IMPLANT
TUBE CONNECTING 20X1/4 (TUBING) IMPLANT
YANKAUER SUCT BULB TIP NO VENT (SUCTIONS) IMPLANT

## 2014-06-26 NOTE — Transfer of Care (Signed)
Immediate Anesthesia Transfer of Care Note  Patient: [name] [name]  Procedure(s) Performed: Procedure(s): EXCISION RIGHT AXILLA  LIPOMA (Right) REMOVAL PORT-A-CATH (Right)  Patient Location: PACU  Anesthesia Type:MAC  Level of Consciousness: awake, alert , oriented and patient cooperative  Airway & Oxygen Therapy: Patient Spontanous Breathing and Patient connected to face mask oxygen  Post-op Assessment: Report given to PACU RN and Post -op Vital signs reviewed and stable  Post vital signs: Reviewed and stable  Complications: No apparent anesthesia complications

## 2014-06-26 NOTE — Discharge Instructions (Signed)

## 2014-06-26 NOTE — Anesthesia Procedure Notes (Signed)
Procedure Name: MAC Date/Time: 06/26/2014 9:25 AM Performed by: Jenafer Winterton Pre-anesthesia Checklist: Patient identified, Timeout performed, Emergency Drugs available, Suction available and Patient being monitored Patient Re-evaluated:Patient Re-evaluated prior to inductionOxygen Delivery Method: Simple face mask

## 2014-06-26 NOTE — H&P (Signed)
  [name] [name]. Main 06/23/2014 2:30 PM Location: [facility] Patient #: [id number] DOB: [birth year] Single / Language: Cleophus Molt / Race: Black or African American Female  History of Present Illness Briant Cedar CMA; 06/23/2014 2:33 PM) Patient words: Recheck lump under Rt. arm.  The patient is a 43 year old female    Other Problems Briant Cedar, [place]; 06/23/2014 2:31 PM) Arthritis Lump In Breast Migraine Headache  Past Surgical History Briant Cedar, [place]; 06/23/2014 2:31 PM) Cesarean Section - 1 Foot Surgery Left.  Diagnostic Studies History Briant Cedar, [place]; 06/23/2014 2:31 PM) Colonoscopy never Mammogram within last year Pap Smear 1-5 years ago  Allergies Briant Cedar, [place]; 06/23/2014 2:34 PM) COCONUT ASPIRIN  Medication History Briant Cedar, CMA; 06/23/2014 2:36 PM) Flexeril (5MG  Tablet, Oral) Active. Ferrous Fumarate (325 (106 Fe)MG Tablet, Oral) Active. Lorcet-HD (5-500MG  Capsule, Oral) Active. Remicade (100MG  For Solution, Intravenous) Active. Provera (10MG  Tablet, Oral) Active. PredniSONE (2.5MG  Tablet, Oral) Active.  Social History Briant Cedar, [place]; 06/23/2014 2:31 PM) Alcohol use Remotely quit alcohol use. Caffeine use Coffee, Tea. No drug use Tobacco use Never smoker.  Family History Briant Cedar, [place]; 06/23/2014 2:31 PM) Alcohol Abuse Father, Sister. Arthritis Family Members In General, Father. Bleeding disorder Mother. Breast Cancer Family Members In General. Depression Family Members In General. Diabetes Mellitus Family Members In General, Mother. Migraine Headache Mother.  Pregnancy / Birth History Briant Cedar, CMA; 06/23/2014 2:31 PM) Age at menarche 22 years. Contraceptive History Contraceptive implant. Gravida 3 Maternal age <15 Para 2 Regular periods  Review of Systems Kathryne Eriksson. Peggye Poon MD; 06/23/2014 3:15 PM) Skin Present- New Lesions (Right axillary growth,  likely lipoma).   Vitals Briant Cedar CMA; 06/23/2014 2:37 PM) 06/23/2014 2:37 PM Weight: 194.38 lb Height: 66in Body Surface Area: 2.03 m Body Mass Index: 31.37 kg/m Temp.: 98.72F  Pulse: 72 (Regular)  BP: 104/64 (Sitting, Left Arm, Standard)    Physical Exam (Kiauna Zywicki O. Evianna Chandran MD; 06/23/2014 3:22 PM) Chest and Lung Exam Palpation Palpation of the chest reveals - Note: Right chest wall port-a-Cath in place that courses above the right claviclel likely into the right IJ vein.   Lymphatic Axillary  Central Axillary Nodes: Right - Mobility - Mobile. Note: Right axillary mass 4x5 cm in size, likely lipoma, tender but mobile.    Assessment & Plan Jeneen Rinks O. 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 Needs surgery. Also has Port-a-Cath right IJ that needs removal. Placed at [facility] Impression: Right axillary lipoma. May be extramammary breast tissue since it is sensitve to hormonal changes. PORT-A-CATH IN PLACE (V45.89  [id number])  Kathryne Eriksson. Dahlia Bailiff, MD, [place] [phone] [facility]

## 2014-06-26 NOTE — Anesthesia Preprocedure Evaluation (Addendum)
Anesthesia Evaluation  Patient identified by MRN, date of birth, ID band Patient awake    Reviewed: Allergy & Precautions, H&P , NPO status , Patient's Chart, lab work & pertinent test results  Airway Mallampati: I TM Distance: >3 FB Neck ROM: Full    Dental  (+) Teeth Intact, Dental Advisory Given   Pulmonary neg pulmonary ROS,  breath sounds clear to auscultation        Cardiovascular negative cardio ROS  Rhythm:Regular Rate:Normal     Neuro/Psych negative neurological ROS  negative psych ROS   GI/Hepatic negative GI ROS, Neg liver ROS,   Endo/Other  Morbid obesity  Renal/GU negative Renal ROS     Musculoskeletal  (+) Arthritis -,   Abdominal   Peds  Hematology  (+) Blood dyscrasia (Hx myelofibrosis), ,   Anesthesia Other Findings   Reproductive/Obstetrics                          Anesthesia Physical Anesthesia Plan  ASA: II  Anesthesia Plan: MAC   Post-op Pain Management:    Induction: Intravenous  Airway Management Planned: Simple Face Mask  Additional Equipment:   Intra-op Plan:   Post-operative Plan:   Informed Consent: I have reviewed the patients History and Physical, chart, labs and discussed the procedure including the risks, benefits and alternatives for the proposed anesthesia with the patient or authorized representative who has indicated his/her understanding and acceptance.   Dental advisory given  Plan Discussed with: CRNA and Surgeon  Anesthesia Plan Comments:        Anesthesia Quick Evaluation

## 2014-06-26 NOTE — Op Note (Signed)
OPERATIVE REPORT  DATE OF OPERATION: 06/26/2014  PATIENT:  [name] [name]  43 y.o. female  PRE-OPERATIVE DIAGNOSIS:  symptomatic growth right axilla and non-useable right port-a-cath  POST-OPERATIVE DIAGNOSIS:  symptomatic growth right axilla and non-useable right port-a-cath  PROCEDURE:  Procedure(s): EXCISION RIGHT AXILLA  LIPOMA REMOVAL PORT-A-CATH  SURGEON:  Surgeon(s): Doreen Salvage, MD  ASSISTANT: None  ANESTHESIA:   local, IV sedation and MAC  EBL: <30 ml  BLOOD ADMINISTERED: none  DRAINS: none   SPECIMEN:  Source of Specimen:  Port intact with catheter  COUNTS CORRECT:  YES  PROCEDURE DETAILS: The patient was taken to the operating room and placed on the table in the supine position. After an adequate amount of IV sedation was given she was prepped and draped in usual sterile manner exposing her right chest wall and right axilla.  A proper timeout was performed identifying the patient and the procedure to be performed. The area of the axillary incision was marked using marking pen and then we anesthetized the area with half-and-half 0.25% Marcaine and 1% Xylocaine. Once the area was anesthetized we made an incision transversely across the axilla using a #15 blade. The lipoma in the axilla was a markedly interdigitated lipoma without a discrete capsule. The area of the most accessible lipoma was excised using electrocautery, dissection with sharp Metzenbaum scissors, and blunt dissection. We removed an area of lipoma measuring approximately 5 cm in maximal diameter.  We subsequently irrigated the wound and closed in 2 layers with 2-0 Vicryl and the skin was closed using interrupted simple stitches of 3-0 nylon. A sterile dressing was subsequently applied.  The patient had a Port-A-Cath which has not been used on her right anterior chest wall above her right nipple. We anesthetized that area and then made an oval incision around her widen previous scar. We subsequently  dissected out the port controlled and then able to remove the catheter from the distal portion of the internal jugular vein. There is minimal difficult removing the catheter. We excise the capsule associated with the port and subtly closed in 2 layers. The subcutaneous tissues reapproximated using 3-0 Vicryl and the skin was closed using running subcuticular stitch of 4-0 Monocryl. Dermabond Steri-Strips and Tegaderm she used to complete these dressings.  All counts were correct.  PATIENT DISPOSITION:  PACU - hemodynamically stable.   Corlene Sabia, JAY 10/16/201510:47 AM

## 2014-06-26 NOTE — Anesthesia Postprocedure Evaluation (Signed)
  Anesthesia Post-op Note  Patient: [name] [name]  Procedure(s) Performed: Procedure(s): EXCISION RIGHT AXILLA  LIPOMA (Right) REMOVAL PORT-A-CATH (Right)  Patient Location: PACU  Anesthesia Type:MAC  Level of Consciousness: awake, alert  and oriented  Airway and Oxygen Therapy: Patient Spontanous Breathing  Post-op Pain: mild  Post-op Assessment: Post-op Vital signs reviewed  Post-op Vital Signs: Reviewed  Last Vitals:  Filed Vitals:   06/26/14 1242  BP:   Pulse:   Temp: 36.6 C  Resp:     Complications: No apparent anesthesia complications

## 2014-06-29 ENCOUNTER — Encounter (HOSPITAL_BASED_OUTPATIENT_CLINIC_OR_DEPARTMENT_OTHER): Payer: Self-pay | Admitting: General Surgery

## 2014-06-29 ENCOUNTER — Other Ambulatory Visit: Payer: Self-pay

## 2014-06-29 DIAGNOSIS — Z1231 Encounter for screening mammogram for malignant neoplasm of breast: Secondary | ICD-10-CM

## 2014-06-30 ENCOUNTER — Telehealth (INDEPENDENT_AMBULATORY_CARE_PROVIDER_SITE_OTHER): Payer: Self-pay

## 2014-06-30 NOTE — Telephone Encounter (Signed)
Pt s/p right axilla lipoma. Pt was calling to see when she needs to come in to have her stitches removed and to get a f/u appt with Dr Hulen Skains. Informed pt that I would send a message to Dr Hulen Skains and  His nurse and we will contact her back as soon as we receive a response. Pt verbalized understanding

## 2014-07-02 NOTE — Telephone Encounter (Signed)
LMOM> needs nurse visit Monday. Make appt when she calls back. Follow up with Dr Hulen Skains 11/3 at 11:40.

## 2014-07-13 ENCOUNTER — Encounter (HOSPITAL_BASED_OUTPATIENT_CLINIC_OR_DEPARTMENT_OTHER): Payer: Self-pay | Admitting: General Surgery

## 2014-08-10 ENCOUNTER — Ambulatory Visit
Admission: RE | Admit: 2014-08-10 | Discharge: 2014-08-10 | Disposition: A | Discharge status: Home / Self Care | Payer: Medicare Other | Source: Ambulatory Visit

## 2014-08-10 DIAGNOSIS — Z1231 Encounter for screening mammogram for malignant neoplasm of breast: Secondary | ICD-10-CM

## 2014-08-25 ENCOUNTER — Other Ambulatory Visit: Payer: Self-pay | Admitting: Nurse Practitioner

## 2014-09-15 DIAGNOSIS — Z79899 Other long term (current) drug therapy: Secondary | ICD-10-CM | POA: Diagnosis not present

## 2014-09-15 DIAGNOSIS — M359 Systemic involvement of connective tissue, unspecified: Secondary | ICD-10-CM | POA: Diagnosis not present

## 2014-10-15 ENCOUNTER — Other Ambulatory Visit (HOSPITAL_BASED_OUTPATIENT_CLINIC_OR_DEPARTMENT_OTHER): Payer: Medicare Other | Admitting: Lab

## 2014-10-15 ENCOUNTER — Ambulatory Visit (HOSPITAL_BASED_OUTPATIENT_CLINIC_OR_DEPARTMENT_OTHER): Payer: Medicare Other | Admitting: Family

## 2014-10-15 ENCOUNTER — Telehealth: Payer: Self-pay | Admitting: Hematology & Oncology

## 2014-10-15 VITALS — BP 111/76 | HR 71 | Temp 97.6°F | Resp 18 | Wt 201.0 lb

## 2014-10-15 DIAGNOSIS — D7581 Myelofibrosis: Secondary | ICD-10-CM

## 2014-10-15 DIAGNOSIS — D509 Iron deficiency anemia, unspecified: Secondary | ICD-10-CM

## 2014-10-15 LAB — IRON AND TIBC CHCC
%SAT: 34 % (ref 21–57)
Iron: 82 ug/dL (ref 41–142)
TIBC: 244 ug/dL (ref 236–444)
UIBC: 162 ug/dL (ref 120–384)

## 2014-10-15 LAB — RETICULOCYTES (CHCC)
ABS Retic: 43.1 10*3/uL (ref 19.0–186.0)
RBC.: 3.92 MIL/uL (ref 3.87–5.11)
Retic Ct Pct: 1.1 % (ref 0.4–2.3)

## 2014-10-15 LAB — CBC WITH DIFFERENTIAL (CANCER CENTER ONLY)
BASO#: 0 10*3/uL (ref 0.0–0.2)
BASO%: 0.7 % (ref 0.0–2.0)
EOS%: 5.5 % (ref 0.0–7.0)
Eosinophils Absolute: 0.2 10*3/uL (ref 0.0–0.5)
HCT: 35.2 % (ref 34.8–46.6)
HGB: 11.8 g/dL (ref 11.6–15.9)
LYMPH#: 1.7 10*3/uL (ref 0.9–3.3)
LYMPH%: 41.3 % (ref 14.0–48.0)
MCH: 31 pg (ref 26.0–34.0)
MCHC: 33.5 g/dL (ref 32.0–36.0)
MCV: 92 fL (ref 81–101)
MONO#: 0.3 10*3/uL (ref 0.1–0.9)
MONO%: 6.9 % (ref 0.0–13.0)
NEUT#: 1.9 10*3/uL (ref 1.5–6.5)
NEUT%: 45.6 % (ref 39.6–80.0)
Platelets: 325 10*3/uL (ref 145–400)
RBC: 3.81 10*6/uL (ref 3.70–5.32)
RDW: 11.8 % (ref 11.1–15.7)
WBC: 4.2 10*3/uL (ref 3.9–10.0)

## 2014-10-15 LAB — COMPREHENSIVE METABOLIC PANEL (CC13)
ALT: 19 U/L (ref 0–55)
AST: 19 U/L (ref 5–34)
Albumin: 3.8 g/dL (ref 3.5–5.0)
Alkaline Phosphatase: 46 U/L (ref 40–150)
Anion Gap: 7 mEq/L (ref 3–11)
BUN: 11 mg/dL (ref 7.0–26.0)
CO2: 25 mEq/L (ref 22–29)
Calcium: 8.8 mg/dL (ref 8.4–10.4)
Chloride: 108 mEq/L (ref 98–109)
Creatinine: 0.8 mg/dL (ref 0.6–1.1)
EGFR: >90 mL/min/{1.73_m2} (ref >90)
Glucose: 82 mg/dl (ref 70–140)
Potassium: 3.9 mEq/L (ref 3.5–5.1)
Sodium: 140 mEq/L (ref 136–145)
Total Bilirubin: 0.59 mg/dL (ref 0.20–1.20)
Total Protein: 6.7 g/dL (ref 6.4–8.3)

## 2014-10-15 LAB — FERRITIN CHCC: Ferritin: 72 ng/ml (ref 9–269)

## 2014-10-15 LAB — CHCC SATELLITE - SMEAR

## 2014-10-15 LAB — LACTATE DEHYDROGENASE: LDH: 91 U/L — ABNORMAL LOW (ref 94–250)

## 2014-10-15 NOTE — Progress Notes (Signed)
[facility]  Telephone:([phone]) [phone] Fax:(336) [phone]  ID: [name] [name] OB: Aug 21, 1971 MR#: [id number] EQA#:[id number] Patient Care Team: Ron Parker, MD as PCP - General (Family Medicine)  DIAGNOSIS: Bone marrow fibrosis Intermittent iron deficiency anemia Juvenile rheumatoid arthritis  INTERVAL HISTORY: [name] [name] is here today for a follow-up. She is doing well. She had her port a cath removed a few months ago as well as a fatty cyst from her right axilla that was benign.  She has had two flares with her myelofibrosis since she was here in August, the last one was earlier this week. She feels that this was brought on by her being on vacation and overdoing it on her birthday. She is feeling better but still a little fatigued. She denies fever, chills, n/v, cough, rash, dizziness, headache, SOB, chest pain, palpitations, abdominal pain, diarrhea, blood in urine or stool. She has had constipation with the iron pills. She is on Protonix for GERD so she will stop the oral iron. We will give her Madilyn Hook is her iron is deficient.  She is still followed by [facility] every 4 months. She self injects her Remicade weekly.  No swelling, tenderness, numbness or tingling in her extremities.  Her appetite is good and she is staying hydrated. Her weight is stable at 201 lbs.   CURRENT TREATMENT: IV iron as indicated  REVIEW OF SYSTEMS: All other 10 point review of systems is negative.   PAST MEDICAL HISTORY: Past Medical History  Diagnosis Date  . Anemia   . Blood transfusion without reported diagnosis   . Arthritis     Myleofibrosis  . Pericarditis     Reoccuring: without flare for 6 months    PAST SURGICAL HISTORY: Past Surgical History  Procedure Laterality Date  . Cesarean section    . Great toe arthrodesis, interphalangeal joint  2011    left  . Port a cath revision      insertion-[facility]  . Lipoma excision Right 06/26/2014    Procedure: EXCISION RIGHT AXILLA   LIPOMA;  Surgeon: Doreen Salvage, MD;  Location: [place];  Service: General;  Laterality: Right;  . Port-a-cath removal Right 06/26/2014    Procedure: REMOVAL PORT-A-CATH;  Surgeon: Doreen Salvage, MD;  Location: [place];  Service: General;  Laterality: Right;    FAMILY HISTORY Family History  Problem Relation Age of Onset  . Diabetes Mother   . Varicose Veins Mother   . Diabetes Paternal Uncle   . Hypertension Paternal Uncle   . Cancer Maternal Grandmother     GYNECOLOGIC HISTORY:  No LMP recorded.   SOCIAL HISTORY: History   Social History  . Marital Status: Divorced    Spouse Name: N/A    Number of Children: N/A  . Years of Education: N/A   Occupational History  . Not on file.   Social History Main Topics  . Smoking status: Never Smoker   . Smokeless tobacco: Never Used     Comment: never used tobacco  . Alcohol Use: No  . Drug Use: No  . Sexual Activity: Yes    Birth Control/ Protection: None   Other Topics Concern  . Not on file   Social History Narrative    ADVANCED DIRECTIVES:  <no information>  HEALTH MAINTENANCE: History  Substance Use Topics  . Smoking status: Never Smoker   . Smokeless tobacco: Never Used     Comment: never used tobacco  . Alcohol Use: No   Colonoscopy:  PAP: Bone density: Lipid panel:  Allergies  Allergen Reactions  . Coconut Fatty Acids     Coconut in raw form  . Aspirin     Current Outpatient Prescriptions  Medication Sig Dispense Refill  . cyclobenzaprine (FLEXERIL) 5 MG tablet Take 5 mg by mouth as needed.     . ferrous sulfate 325 (65 FE) MG tablet Take 325 mg by mouth 3 (three) times daily with meals.      . hydrocodone-acetaminophen (LORCET-HD) 5-500 MG per capsule Take 1 capsule by mouth every 6 (six) hours as needed.      Marland Kitchen HYDROcodone-acetaminophen (NORCO/VICODIN) 5-325 MG per tablet Take 1-2 tablets by mouth every 4 (four) hours as needed for moderate pain. 30 tablet 0  .  inFLIXimab (REMICADE) 100 MG injection Inject into the vein once a week.     No current facility-administered medications for this visit.    OBJECTIVE: There were no vitals filed for this visit. There were no vitals filed for this visit. ECOG FS:0 - Asymptomatic Ocular: Sclerae unicteric, pupils equal, round and reactive to light Ear-nose-throat: Oropharynx clear, dentition fair Lymphatic: No cervical or supraclavicular adenopathy Lungs no rales or rhonchi, good excursion bilaterally Heart regular rate and rhythm, no murmur appreciated Abd soft, nontender, positive bowel sounds MSK no focal spinal tenderness, no joint edema Neuro: non-focal, well-oriented, appropriate affect Breasts: Deferred  LAB RESULTS: CMP     Component Value Date/Time   NA 140 06/25/2014 1315   K 4.6 06/25/2014 1315   CL 103 06/25/2014 1315   CO2 27 06/25/2014 1315   GLUCOSE 78 06/25/2014 1315   BUN 12 06/25/2014 1315   CREATININE 0.97 06/25/2014 1315   CREATININE 0.77 06/23/2014 1525   CALCIUM 9.5 06/25/2014 1315   PROT 7.7 06/25/2014 1315   ALBUMIN 4.1 06/25/2014 1315   AST 15 06/25/2014 1315   ALT 15 06/25/2014 1315   ALKPHOS 44 06/25/2014 1315   BILITOT 0.6 06/25/2014 1315   GFRNONAA 71* 06/25/2014 1315   GFRAA 82* 06/25/2014 1315   INo results found for: SPEP, UPEP Lab Results  Component Value Date   WBC 4.2 10/15/2014   NEUTROABS 1.9 10/15/2014   HGB 11.8 10/15/2014   HCT 35.2 10/15/2014   MCV 92 10/15/2014   PLT 325 10/15/2014   No results found for: LABCA2 No components found for: [id number] No results for input(s): INR in the last 168 hours. Urinalysis No results found for: COLORURINE, APPEARANCEUR, LABSPEC, PHURINE, GLUCOSEU, HGBUR, BILIRUBINUR, KETONESUR, PROTEINUR, UROBILINOGEN, NITRITE, LEUKOCYTESUR STUDIES: No results found.  ASSESSMENT/PLAN: [name] [name] is 44 year old African American with bone marrow fibrosis. She does not have a underlying myelo fibrosis. The bone marrow  fibrosis appears to be from the history of rheumatoid arthritis. She is doing well and has no complaints today. She is still followed by [facility].  Her CBC today was normal. We will see what her iron studies show.  We will see her back in 6 months for labs and follow-up.  She knows to call here with any questions or concerns and to go to the ED in the event of an emergency. We can certainly see her sooner if need be.   Eliezer Bottom, NP 10/15/2014 9:18 AM

## 2014-10-15 NOTE — Telephone Encounter (Signed)
Mailed august schedule °

## 2014-11-16 ENCOUNTER — Encounter: Payer: Self-pay | Admitting: Obstetrics & Gynecology

## 2014-11-16 ENCOUNTER — Ambulatory Visit (INDEPENDENT_AMBULATORY_CARE_PROVIDER_SITE_OTHER): Payer: Medicare Other | Admitting: Obstetrics & Gynecology

## 2014-11-16 VITALS — BP 104/60 | HR 80 | Temp 98.8°F | Ht 66.0 in | Wt 199.0 lb

## 2014-11-16 DIAGNOSIS — N938 Other specified abnormal uterine and vaginal bleeding: Secondary | ICD-10-CM | POA: Diagnosis not present

## 2014-11-16 NOTE — Progress Notes (Signed)
Subjective:     Patient ID: [name] [name], female   DOB: 12-30-1970, 44 y.o.   MRN: [id number]  HPI Pt reports continued irreg bleeding that is heavy even with the IUD.  She reports that she wants more reliable treatment and wants to consider the endometrial ablation that was discussed on her last visit.  She reports that she has been having menses 2x/month with heavy flow.  She denies dizziness or SOB.  Past Medical History  Diagnosis Date  . Anemia   . Blood transfusion without reported diagnosis   . Arthritis     Myleofibrosis  . Pericarditis     Reoccuring: without flare for 6 months   Past Surgical History  Procedure Laterality Date  . Cesarean section    . Great toe arthrodesis, interphalangeal joint  2011    left  . Port a cath revision      insertion-[facility]  . Lipoma excision Right 06/26/2014    Procedure: EXCISION RIGHT AXILLA  LIPOMA;  Surgeon: Doreen Salvage, MD;  Location: [place];  Service: General;  Laterality: Right;  . Port-a-cath removal Right 06/26/2014    Procedure: REMOVAL PORT-A-CATH;  Surgeon: Doreen Salvage, MD;  Location: [facility];  Service: General;  Laterality: Right;   Current Outpatient Prescriptions on File Prior to Visit  Medication Sig Dispense Refill  . ferrous sulfate 325 (65 FE) MG tablet Take 325 mg by mouth 3 (three) times daily with meals.      . hydrocodone-acetaminophen (LORCET-HD) 5-500 MG per capsule Take 1 capsule by mouth every 6 (six) hours as needed.      . inFLIXimab (REMICADE) 100 MG injection Inject into the vein once a week.    . cyclobenzaprine (FLEXERIL) 5 MG tablet Take 5 mg by mouth as needed.     Marland Kitchen HYDROcodone-acetaminophen (NORCO/VICODIN) 5-325 MG per tablet Take 1-2 tablets by mouth every 4 (four) hours as needed for moderate pain. (Patient not taking: Reported on 11/16/2014) 30 tablet 0   No current facility-administered medications on file prior to visit.   Allergies  Allergen Reactions  . Coconut  Fatty Acids     Coconut in raw form  . Aspirin     Review of Systems     Objective:   Physical Exam BP 104/60 mmHg  Pulse 80  Temp(Src) 98.8 F (37.1 C) (Oral)  Ht 5\' 6"  (1.676 m)  Wt 199 lb (90.266 kg)  BMI 32.13 kg/m2  LMP 10/29/2013 (Exact Date) Pt in NAD Abd: soft, NT, ND.  Well healed vertical incision GU: EGBUS: no lesions Vagina: no blood in vault Cervix: no lesion; no mucopurulent d/c; no CMT Uterus: small, mobile Adnexa: no masses; non tender        Assessment:     DUB- persistent despite LnIUD.  Pt wants further treatment but, does not want a hyst.  She is interested in an ablation that was discussed on her last visit.       Plan:     Patient desires surgical management with hysteroscopy with endometrial ablation using Novasure.  The risks of surgery were discussed in detail with the patient including but not limited to: bleeding which may require transfusion or reoperation; infection which may require prolonged hospitalization or re-hospitalization and antibiotic therapy; injury to bowel, bladder, ureters and major vessels or other surrounding organs; need for additional procedures including laparotomy; thromboembolic phenomenon, incisional problems and other postoperative or anesthesia complications.  Patient was told that the likelihood that her  condition and symptoms will be treated effectively with this surgical management was very high; the postoperative expectations were also discussed in detail. The patient also understands the alternative treatment options which were discussed in full. All questions were answered.  She was told that she will be contacted by our surgical scheduler regarding the time and date of her surgery; routine preoperative instructions of having nothing to eat or drink after midnight on the day prior to surgery and also coming to the hospital 1 1/2 hours prior to her time of surgery were also emphasized.  She was told she may be called for a  preoperative appointment about a week prior to surgery and will be given further preoperative instructions at that visit. Printed patient education handouts about the procedure were given to the patient to review at home.

## 2014-11-16 NOTE — Patient Instructions (Signed)
Endometrial Ablation Endometrial ablation removes the lining of the uterus (endometrium). It is usually a same-day, outpatient treatment. Ablation helps avoid major surgery, such as surgery to remove the cervix and uterus (hysterectomy). After endometrial ablation, you will have little or no menstrual bleeding and may not be able to have children. However, if you are premenopausal, you will need to use a reliable method of birth control following the procedure because of the small chance that pregnancy can occur. There are different reasons to have this procedure, which include:  Heavy periods.  Bleeding that is causing anemia.  Irregular bleeding.  Bleeding fibroids on the lining inside the uterus if they are smaller than 3 centimeters. This procedure should not be done if:  You want children in the future.  You have severe cramps with your menstrual period.  You have precancerous or cancerous cells in your uterus.  You were recently pregnant.  You have gone through menopause.  You have had major surgery on the uterus, such as a cesarean delivery. LET YOUR HEALTH CARE PROVIDER KNOW ABOUT:  Any allergies you have.  All medicines you are taking, including vitamins, herbs, eye drops, creams, and over-the-counter medicines.  Previous problems you or members of your family have had with the use of anesthetics.  Any blood disorders you have.  Previous surgeries you have had.  Medical conditions you have. RISKS AND COMPLICATIONS  Generally, this is a safe procedure. However, as with any procedure, complications can occur. Possible complications include:  Perforation of the uterus.  Bleeding.  Infection of the uterus, bladder, or vagina.  Injury to surrounding organs.  An air bubble to the lung (air embolus).  Pregnancy following the procedure.  Failure of the procedure to help the problem, requiring hysterectomy.  Decreased ability to diagnose cancer in the lining of  the uterus. BEFORE THE PROCEDURE  The lining of the uterus must be tested to make sure there is no pre-cancerous or cancer cells present.  An ultrasound may be performed to look at the size of the uterus and to check for abnormalities.  Medicines may be given to thin the lining of the uterus. PROCEDURE  During the procedure, your health care provider will use a tool called a resectoscope to help see inside your uterus. There are different ways to remove the lining of your uterus.   Radiofrequency - This method uses a radiofrequency-alternating electric current to remove the lining of the uterus.  Cryotherapy - This method uses extreme cold to freeze the lining of the uterus.  Heated-Free Liquid - This method uses heated salt (saline) solution to remove the lining of the uterus.  Microwave - This method uses high-energy microwaves to heat up the lining of the uterus to remove it.  Thermal balloon - This method involves inserting a catheter with a balloon tip into the uterus. The balloon tip is filled with heated fluid to remove the lining of the uterus. AFTER THE PROCEDURE  After your procedure, do not have sexual intercourse or insert anything into your vagina until permitted by your health care provider. After the procedure, you may experience:  Cramps.  Vaginal discharge.  Frequent urination. Document Released: 07/07/2004 Document Revised: 04/30/2013 Document Reviewed: 01/29/2013 ExitCare Patient Information 2015 [employer]. This information is not intended to replace advice given to you by your health care provider. Make sure you discuss any questions you have with your health care provider.  

## 2014-12-01 ENCOUNTER — Telehealth: Payer: Self-pay | Admitting: *Deleted

## 2014-12-01 NOTE — Telephone Encounter (Signed)
Pt left message stating that she is scheduled for surgery on 4/14. She wants to cancel the surgery and asks that a message be forwarded to Dr. Ihor Dow. I called pt and discussed her request. She stated that for now she is doing some research to investigate other options for her problem. She may still want the surgery but wishes to postpone at this time. I advised pt to call back if she decides to go forward with the surgery so that it can be rescheduled. She may need to see Dr. Ihor Dow in the office again prior to surgery.  I will notify Dr. Ihor Dow of pt's decision.  Pt agreed and voiced understanding.

## 2014-12-03 DIAGNOSIS — D649 Anemia, unspecified: Secondary | ICD-10-CM | POA: Diagnosis not present

## 2014-12-09 ENCOUNTER — Inpatient Hospital Stay (HOSPITAL_COMMUNITY): Admission: RE | Admit: 2014-12-09 | Payer: Medicare Other | Source: Ambulatory Visit

## 2014-12-24 ENCOUNTER — Ambulatory Visit (HOSPITAL_COMMUNITY)
Admission: RE | Admit: 2014-12-24 | Payer: Medicare Other | Source: Ambulatory Visit | Admitting: Obstetrics & Gynecology

## 2014-12-24 ENCOUNTER — Encounter (HOSPITAL_COMMUNITY): Admission: RE | Payer: Self-pay | Source: Ambulatory Visit

## 2014-12-24 SURGERY — HYSTEROSCOPY WITH NOVASURE
Anesthesia: Choice | Site: Vagina

## 2015-01-21 DIAGNOSIS — M1990 Unspecified osteoarthritis, unspecified site: Secondary | ICD-10-CM | POA: Diagnosis not present

## 2015-01-21 DIAGNOSIS — Z79899 Other long term (current) drug therapy: Secondary | ICD-10-CM | POA: Diagnosis not present

## 2015-01-21 DIAGNOSIS — M2550 Pain in unspecified joint: Secondary | ICD-10-CM | POA: Diagnosis not present

## 2015-03-01 ENCOUNTER — Ambulatory Visit (INDEPENDENT_AMBULATORY_CARE_PROVIDER_SITE_OTHER): Payer: Medicare Other | Admitting: Obstetrics & Gynecology

## 2015-03-01 ENCOUNTER — Encounter: Payer: Self-pay | Admitting: Obstetrics & Gynecology

## 2015-03-01 VITALS — BP 115/77 | HR 69 | Ht 66.0 in | Wt 188.5 lb

## 2015-03-01 DIAGNOSIS — N939 Abnormal uterine and vaginal bleeding, unspecified: Secondary | ICD-10-CM

## 2015-03-01 NOTE — Progress Notes (Signed)
Patient ID: [name] [name], female   DOB: 10-14-1970, 44 y.o.   MRN: [id number] History:  44 y.o. [id number] here today for eval of AUB.  She was prev scheduled for an ablation but, she got scared adn calncelled it.  She reports that she is now having irreg daily bleeding that is not improved with the Mirena.   She denies pain with th bleeding or clots.  She denies weakness assoc with blood loss.  The bleeding is not heavy but,it is persistent.  The following portions of the patient's history were reviewed and updated as appropriate: allergies, current medications, past family history, past medical history, past social history, past surgical history and problem list.  Review of Systems:  Pertinent items are noted in HPI.  Objective:  Physical Exam Blood pressure 115/77, pulse 69, height 5\' 6"  (1.676 m), weight 188 lb 8 oz (85.503 kg), last menstrual period 02/17/2015. Gen: NAD HEENT: nonicteric Resp; normal respirations GU: not repeated   Assessment & Plan:  AUB- pt with continued bleeding despite Mirena,  Pt reports that she is ready now for the ablation. Pt does not want a hysterectomy at this time.  Explained the riskls of adenomyosis post procedure and that if that happens the only tx is a hyst.  She expresses understanding.  Pelvic sono to r/o submucosal fibroids.  If thickened endometrium will schedule resection of fibroid or polyp at the same time.  Patient desires surgical management with hysteroscopy with endometrial ablation.  The risks of surgery were discussed in detail with the patient including but not limited to: bleeding which may require transfusion or reoperation; infection which may require prolonged hospitalization or re-hospitalization and antibiotic therapy; injury to bowel, bladder, ureters and major vessels or other surrounding organs; need for additional procedures including laparotomy; thromboembolic phenomenon, incisional problems and other postoperative or anesthesia  complications.  Patient was told that the likelihood that her condition and symptoms will be treated effectively with this surgical management was very high; the postoperative expectations were also discussed in detail. The patient also understands the alternative treatment options which were discussed in full. All questions were answered.  She was told that she will be contacted by our surgical scheduler regarding the time and date of her surgery; routine preoperative instructions of having nothing to eat or drink after midnight on the day prior to surgery and also coming to the hospital 1 1/2 hours prior to her time of surgery were also emphasized.  She was told she may be called for a preoperative appointment about a week prior to surgery and will be given further preoperative instructions at that visit. Printed patient education handouts about the procedure were given to the patient to review at home.  Talis Iwan L. Harraway-Smith, M.D., Cherlynn June

## 2015-03-01 NOTE — Patient Instructions (Signed)
Endometrial Ablation Endometrial ablation removes the lining of the uterus (endometrium). It is usually a same-day, outpatient treatment. Ablation helps avoid major surgery, such as surgery to remove the cervix and uterus (hysterectomy). After endometrial ablation, you will have little or no menstrual bleeding and may not be able to have children. However, if you are premenopausal, you will need to use a reliable method of birth control following the procedure because of the small chance that pregnancy can occur. There are different reasons to have this procedure, which include:  Heavy periods.  Bleeding that is causing anemia.  Irregular bleeding.  Bleeding fibroids on the lining inside the uterus if they are smaller than 3 centimeters. This procedure should not be done if:  You want children in the future.  You have severe cramps with your menstrual period.  You have precancerous or cancerous cells in your uterus.  You were recently pregnant.  You have gone through menopause.  You have had major surgery on the uterus, such as a cesarean delivery. LET YOUR HEALTH CARE PROVIDER KNOW ABOUT:  Any allergies you have.  All medicines you are taking, including vitamins, herbs, eye drops, creams, and over-the-counter medicines.  Previous problems you or members of your family have had with the use of anesthetics.  Any blood disorders you have.  Previous surgeries you have had.  Medical conditions you have. RISKS AND COMPLICATIONS  Generally, this is a safe procedure. However, as with any procedure, complications can occur. Possible complications include:  Perforation of the uterus.  Bleeding.  Infection of the uterus, bladder, or vagina.  Injury to surrounding organs.  An air bubble to the lung (air embolus).  Pregnancy following the procedure.  Failure of the procedure to help the problem, requiring hysterectomy.  Decreased ability to diagnose cancer in the lining of  the uterus. BEFORE THE PROCEDURE  The lining of the uterus must be tested to make sure there is no pre-cancerous or cancer cells present.  An ultrasound may be performed to look at the size of the uterus and to check for abnormalities.  Medicines may be given to thin the lining of the uterus. PROCEDURE  During the procedure, your health care provider will use a tool called a resectoscope to help see inside your uterus. There are different ways to remove the lining of your uterus.   Radiofrequency - This method uses a radiofrequency-alternating electric current to remove the lining of the uterus.  Cryotherapy - This method uses extreme cold to freeze the lining of the uterus.  Heated-Free Liquid - This method uses heated salt (saline) solution to remove the lining of the uterus.  Microwave - This method uses high-energy microwaves to heat up the lining of the uterus to remove it.  Thermal balloon - This method involves inserting a catheter with a balloon tip into the uterus. The balloon tip is filled with heated fluid to remove the lining of the uterus. AFTER THE PROCEDURE  After your procedure, do not have sexual intercourse or insert anything into your vagina until permitted by your health care provider. After the procedure, you may experience:  Cramps.  Vaginal discharge.  Frequent urination. Document Released: 07/07/2004 Document Revised: 04/30/2013 Document Reviewed: 01/29/2013 ExitCare Patient Information 2015 [employer]. This information is not intended to replace advice given to you by your health care provider. Make sure you discuss any questions you have with your health care provider.  

## 2015-03-02 ENCOUNTER — Encounter (HOSPITAL_COMMUNITY): Payer: Self-pay | Admitting: *Deleted

## 2015-03-08 ENCOUNTER — Ambulatory Visit (HOSPITAL_COMMUNITY): Payer: Medicare Other

## 2015-03-09 ENCOUNTER — Ambulatory Visit (HOSPITAL_COMMUNITY)
Admission: RE | Admit: 2015-03-09 | Discharge: 2015-03-09 | Disposition: A | Discharge status: Home / Self Care | Payer: Medicare Other | Source: Ambulatory Visit | Attending: Obstetrics & Gynecology | Admitting: Obstetrics & Gynecology

## 2015-03-09 DIAGNOSIS — Z975 Presence of (intrauterine) contraceptive device: Secondary | ICD-10-CM | POA: Diagnosis not present

## 2015-03-09 DIAGNOSIS — N939 Abnormal uterine and vaginal bleeding, unspecified: Secondary | ICD-10-CM | POA: Diagnosis not present

## 2015-03-09 DIAGNOSIS — Z30431 Encounter for routine checking of intrauterine contraceptive device: Secondary | ICD-10-CM | POA: Diagnosis not present

## 2015-03-11 ENCOUNTER — Encounter (HOSPITAL_COMMUNITY): Payer: Self-pay | Admitting: *Deleted

## 2015-04-14 ENCOUNTER — Encounter (HOSPITAL_COMMUNITY): Payer: Self-pay

## 2015-04-14 ENCOUNTER — Encounter (HOSPITAL_COMMUNITY)
Admission: RE | Admit: 2015-04-14 | Discharge: 2015-04-14 | Disposition: A | Discharge status: Home / Self Care | Payer: Medicare Other | Source: Ambulatory Visit | Attending: Obstetrics & Gynecology | Admitting: Obstetrics & Gynecology

## 2015-04-14 DIAGNOSIS — Z01818 Encounter for other preprocedural examination: Secondary | ICD-10-CM | POA: Insufficient documentation

## 2015-04-14 DIAGNOSIS — N939 Abnormal uterine and vaginal bleeding, unspecified: Secondary | ICD-10-CM | POA: Diagnosis not present

## 2015-04-14 HISTORY — DX: Reserved for inherently not codable concepts without codable children: IMO0001

## 2015-04-14 LAB — CBC
HCT: 35.7 % — ABNORMAL LOW (ref 36.0–46.0)
Hemoglobin: 11.9 g/dL — ABNORMAL LOW (ref 12.0–15.0)
MCH: 30.1 pg (ref 26.0–34.0)
MCHC: 33.3 g/dL (ref 30.0–36.0)
MCV: 90.4 fL (ref 78.0–100.0)
Platelets: 312 10*3/uL (ref 150–400)
RBC: 3.95 MIL/uL (ref 3.87–5.11)
RDW: 12.7 % (ref 11.5–15.5)
WBC: 4.3 10*3/uL (ref 4.0–10.5)

## 2015-04-14 NOTE — Anesthesia Preprocedure Evaluation (Addendum)
Anesthesia Evaluation  Patient identified by MRN, date of birth, ID band Patient awake    Reviewed: Allergy & Precautions, H&P , NPO status , Patient's Chart, lab work & pertinent test results  Airway Mallampati: I  TM Distance: >3 FB Neck ROM: Full    Dental  (+) Teeth Intact, Dental Advisory Given   Pulmonary neg pulmonary ROS,  breath sounds clear to auscultation        Cardiovascular negative cardio ROS  Rhythm:Regular Rate:Normal     Neuro/Psych negative neurological ROS  negative psych ROS   GI/Hepatic negative GI ROS, Neg liver ROS,   Endo/Other    Renal/GU negative Renal ROS     Musculoskeletal  (+) Arthritis -,   Abdominal (+)  Abdomen: soft.    Peds  Hematology  (+) Blood dyscrasia (Hx myelofibrosis), , 12/36   Anesthesia Other Findings   Reproductive/Obstetrics                            Anesthesia Physical  Anesthesia Plan  ASA: II  Anesthesia Plan: General   Post-op Pain Management:    Induction: Intravenous  Airway Management Planned: LMA  Additional Equipment:   Intra-op Plan:   Post-operative Plan:   Informed Consent: I have reviewed the patients History and Physical, chart, labs and discussed the procedure including the risks, benefits and alternatives for the proposed anesthesia with the patient or authorized representative who has indicated his/her understanding and acceptance.   Dental advisory given  Plan Discussed with: CRNA and Surgeon  Anesthesia Plan Comments: (Ask about resent Prednisone RX)        Anesthesia Quick Evaluation

## 2015-04-14 NOTE — Patient Instructions (Addendum)
Your procedure is scheduled on:04/20/15  Enter through the Main Entrance at :2:30 pm Pick up desk phone and dial [phone] and inform us of your arrival.  Please call [phone] if you have any problems the morning of surgery.  Remember: Do not eat food after midnight:Monday Clear liquids are ok until: 1:00 pm on Tuesday (per Dr. Jillyn Hidden)  You may brush your teeth the morning of surgery.  Take these meds the morning of surgery with a sip of water:all usual morning meds but no supplements  DO NOT wear jewelry, eye make-up, lipstick,body lotion, or dark fingernail polish.  (Polished toes are ok) You may wear deodorant.  If you are to be admitted after surgery, leave suitcase in car until your room has been assigned. Patients discharged on the day of surgery will not be allowed to drive home. Wear loose fitting, comfortable clothes for your ride home.

## 2015-04-15 ENCOUNTER — Other Ambulatory Visit (HOSPITAL_BASED_OUTPATIENT_CLINIC_OR_DEPARTMENT_OTHER): Payer: Medicare Other

## 2015-04-15 ENCOUNTER — Ambulatory Visit (HOSPITAL_BASED_OUTPATIENT_CLINIC_OR_DEPARTMENT_OTHER): Payer: Medicare Other | Admitting: Hematology & Oncology

## 2015-04-15 ENCOUNTER — Encounter: Payer: Self-pay | Admitting: Hematology & Oncology

## 2015-04-15 ENCOUNTER — Telehealth: Payer: Self-pay | Admitting: *Deleted

## 2015-04-15 VITALS — BP 110/68 | HR 61 | Temp 98.6°F | Resp 16 | Ht 66.0 in | Wt 190.0 lb

## 2015-04-15 DIAGNOSIS — D509 Iron deficiency anemia, unspecified: Secondary | ICD-10-CM

## 2015-04-15 DIAGNOSIS — M0800 Unspecified juvenile rheumatoid arthritis of unspecified site: Secondary | ICD-10-CM | POA: Diagnosis not present

## 2015-04-15 DIAGNOSIS — D7581 Myelofibrosis: Secondary | ICD-10-CM

## 2015-04-15 LAB — CMP (CANCER CENTER ONLY)
ALT(SGPT): 15 U/L (ref 10–47)
AST: 19 U/L (ref 11–38)
Albumin: 3.6 g/dL (ref 3.3–5.5)
Alkaline Phosphatase: 38 U/L (ref 26–84)
BUN, Bld: 15 mg/dL (ref 7–22)
CO2: 27 mEq/L (ref 18–33)
Calcium: 9.2 mg/dL (ref 8.0–10.3)
Chloride: 108 mEq/L (ref 98–108)
Creat: 1 mg/dl (ref 0.6–1.2)
Glucose, Bld: 89 mg/dL (ref 73–118)
Potassium: 4.4 mEq/L (ref 3.3–4.7)
Sodium: 140 mEq/L (ref 128–145)
Total Bilirubin: 0.9 mg/dl (ref 0.20–1.60)
Total Protein: 6.7 g/dL (ref 6.4–8.1)

## 2015-04-15 LAB — IRON AND TIBC CHCC
%SAT: 44 % (ref 21–57)
Iron: 108 ug/dL (ref 41–142)
TIBC: 243 ug/dL (ref 236–444)
UIBC: 135 ug/dL (ref 120–384)

## 2015-04-15 LAB — CBC WITH DIFFERENTIAL (CANCER CENTER ONLY)
BASO#: 0 10*3/uL (ref 0.0–0.2)
BASO%: 0.5 % (ref 0.0–2.0)
EOS%: 4.4 % (ref 0.0–7.0)
Eosinophils Absolute: 0.2 10*3/uL (ref 0.0–0.5)
HCT: 35 % (ref 34.8–46.6)
HGB: 11.7 g/dL (ref 11.6–15.9)
LYMPH#: 1.5 10*3/uL (ref 0.9–3.3)
LYMPH%: 39 % (ref 14.0–48.0)
MCH: 30.6 pg (ref 26.0–34.0)
MCHC: 33.4 g/dL (ref 32.0–36.0)
MCV: 92 fL (ref 81–101)
MONO#: 0.3 10*3/uL (ref 0.1–0.9)
MONO%: 7 % (ref 0.0–13.0)
NEUT#: 1.9 10*3/uL (ref 1.5–6.5)
NEUT%: 49.1 % (ref 39.6–80.0)
Platelets: 311 10*3/uL (ref 145–400)
RBC: 3.82 10*6/uL (ref 3.70–5.32)
RDW: 12.2 % (ref 11.1–15.7)
WBC: 3.9 10*3/uL (ref 3.9–10.0)

## 2015-04-15 LAB — RETICULOCYTES (CHCC)
ABS Retic: 34.9 10*3/uL (ref 19.0–186.0)
RBC.: 3.88 MIL/uL (ref 3.87–5.11)
Retic Ct Pct: 0.9 % (ref 0.4–2.3)

## 2015-04-15 LAB — LACTATE DEHYDROGENASE: LDH: 96 U/L (ref 94–250)

## 2015-04-15 LAB — FERRITIN CHCC: Ferritin: 49 ng/ml (ref 9–269)

## 2015-04-15 LAB — CHCC SATELLITE - SMEAR

## 2015-04-15 NOTE — Progress Notes (Signed)
Hematology and Oncology Follow Up Visit  BRADYN VASSEY [id number] 1970-12-01 44 y.o. 04/15/2015   Principle Diagnosis:   Bone marrow fibrosis  Intermittent iron deficiency anemia  Juvenile rheumatoid arthritis  Current Therapy:    IV iron as indicated         Interim History:  Ms.  [name] [name] is back for followup. She looks great. She's had no problems this summer. She's working.   She is being tapered off her Remicade. She gets this to herself. She is also on some steroids. I think these are probably being tapered down.  She's had no nausea or vomiting. She's had no bleeding. There's been no change in bowel or bladder habits.  We last saw her in February, her ferritin was 72 with an iron saturation 34%.  Overall, her performance status is ECOG 0.   Medications:  Current outpatient prescriptions:  .  Acetaminophen-Guaifenesin 650-400 MG TABS, Take by mouth., Disp: , Rfl:  .  ferrous sulfate 325 (65 FE) MG tablet, Take 325 mg by mouth 3 (three) times daily with meals.  , Disp: , Rfl:  .  folic acid (FOLVITE) 1 MG tablet, Take 1 mg by mouth daily., Disp: , Rfl:  .  HYDROcodone-acetaminophen (NORCO/VICODIN) 5-325 MG per tablet, Take 1-2 tablets by mouth every 4 (four) hours as needed for moderate pain., Disp: 30 tablet, Rfl: 0 .  hydroxychloroquine (PLAQUENIL) 200 MG tablet, Take 400 mg by mouth daily., Disp: , Rfl:  .  Methotrexate, PF, 25 MG/0.4ML SOAJ, Inject 1 Dose into the skin. Patients does her shot eveyr sunday, Disp: , Rfl:  .  multivitamin-iron-minerals-folic acid (THERAPEUTIC-M) TABS tablet, Take 1 tablet by mouth daily., Disp: , Rfl:  .  oxyCODONE (OXY IR/ROXICODONE) 5 MG immediate release tablet, Take by mouth., Disp: , Rfl:  .  predniSONE (DELTASONE) 5 MG tablet, Take 3 mg by mouth daily with breakfast., Disp: , Rfl:   Allergies:  Allergies  Allergen Reactions  . Coconut Fatty Acids     Coconut in raw form  . Aspirin     Past Medical History, Surgical  history, Social history, and Family History were reviewed and updated.  Review of Systems: As above  Physical Exam:  height is 5\' 6"  (1.676 m) and weight is 190 lb (86.183 kg). Her oral temperature is 98.6 F (37 C). Her blood pressure is 110/68 and her pulse is 61. Her respiration is 16.   Well-developed and well-nourished Afro-American female. Head and exam shows nausea or oral lesions. She has no palpable cervical or supraclavicular lymph nodes. Lungs are clear bilaterally. Cardiac exam regular rate and rhythm with no murmurs rubs or bruits. Abdomen is soft. His good bowel sounds. There is no palpable abdominal mass. There is no palpable liver or spleen tip. Back exam shows no tenderness over the spine ribs or hips. Extremities shows no clubbing cyanosis or edema. She has no swelling with her knees. His good range of motion of her knees. No clicking or crepitus changes are noted with knee range of motion. Skin exam shows no rashes, ecchymosis or petechia. Neurological exam is nonfocal. Lab Results  Component Value Date   WBC 3.9 04/15/2015   HGB 11.7 04/15/2015   HCT 35.0 04/15/2015   MCV 92 04/15/2015   PLT 311 04/15/2015     Chemistry      Component Value Date/Time   NA 140 04/15/2015 0933   NA 140 10/15/2014 0906   NA 140 06/25/2014 1315   K 4.4  04/15/2015 0933   K 3.9 10/15/2014 0906   K 4.6 06/25/2014 1315   CL 108 04/15/2015 0933   CL 103 06/25/2014 1315   CO2 27 04/15/2015 0933   CO2 25 10/15/2014 0906   CO2 27 06/25/2014 1315   BUN 15 04/15/2015 0933   BUN 11.0 10/15/2014 0906   BUN 12 06/25/2014 1315   CREATININE 1.0 04/15/2015 0933   CREATININE 0.8 10/15/2014 0906   CREATININE 0.97 06/25/2014 1315      Component Value Date/Time   CALCIUM 9.2 04/15/2015 0933   CALCIUM 8.8 10/15/2014 0906   CALCIUM 9.5 06/25/2014 1315   ALKPHOS 38 04/15/2015 0933   ALKPHOS 46 10/15/2014 0906   ALKPHOS 44 06/25/2014 1315   AST 19 04/15/2015 0933   AST 19 10/15/2014 0906   AST  15 06/25/2014 1315   ALT 15 04/15/2015 0933   ALT 19 10/15/2014 0906   ALT 15 06/25/2014 1315   BILITOT 0.90 04/15/2015 0933   BILITOT 0.59 10/15/2014 0906   BILITOT 0.6 06/25/2014 1315         Impression and Plan: Ms. Klingensmith is 44 year old African-American female. She has secondary bone marrow fibrosis. She has rheumatoid arthritis. She actually is being tapered off her Remicade.  I looked at her blood under the microscope. I do not see anything that looked suspicious.  She is enjoying the fact that her Port-A-Cath is out now.  I think we'll probably get her back now in about 6 more months.    [name], MD 8/4/201611:51 AM

## 2015-04-15 NOTE — Telephone Encounter (Addendum)
Patient is aware of results.   ----- Message from [name], MD sent at 04/15/2015  1:46 PM EDT ----- Call - iron level is ok!!!  [name] [name]

## 2015-04-20 ENCOUNTER — Ambulatory Visit (HOSPITAL_COMMUNITY): Payer: Medicare Other | Admitting: Anesthesiology

## 2015-04-20 ENCOUNTER — Ambulatory Visit (HOSPITAL_COMMUNITY)
Admission: RE | Admit: 2015-04-20 | Discharge: 2015-04-20 | Disposition: A | Discharge status: Home / Self Care | Payer: Medicare Other | Source: Ambulatory Visit | Attending: Obstetrics & Gynecology | Admitting: Obstetrics & Gynecology

## 2015-04-20 ENCOUNTER — Encounter (HOSPITAL_COMMUNITY)
Admission: RE | Disposition: A | Discharge status: Home / Self Care | Payer: Self-pay | Source: Ambulatory Visit | Attending: Obstetrics & Gynecology

## 2015-04-20 ENCOUNTER — Encounter (HOSPITAL_COMMUNITY): Payer: Self-pay

## 2015-04-20 DIAGNOSIS — N939 Abnormal uterine and vaginal bleeding, unspecified: Secondary | ICD-10-CM | POA: Diagnosis not present

## 2015-04-20 DIAGNOSIS — D649 Anemia, unspecified: Secondary | ICD-10-CM | POA: Diagnosis not present

## 2015-04-20 DIAGNOSIS — D7581 Myelofibrosis: Secondary | ICD-10-CM

## 2015-04-20 DIAGNOSIS — Z30432 Encounter for removal of intrauterine contraceptive device: Secondary | ICD-10-CM | POA: Diagnosis not present

## 2015-04-20 DIAGNOSIS — N840 Polyp of corpus uteri: Secondary | ICD-10-CM | POA: Diagnosis not present

## 2015-04-20 DIAGNOSIS — D250 Submucous leiomyoma of uterus: Secondary | ICD-10-CM

## 2015-04-20 DIAGNOSIS — M1990 Unspecified osteoarthritis, unspecified site: Secondary | ICD-10-CM | POA: Diagnosis not present

## 2015-04-20 DIAGNOSIS — D509 Iron deficiency anemia, unspecified: Secondary | ICD-10-CM | POA: Diagnosis present

## 2015-04-20 HISTORY — PX: DILITATION & CURRETTAGE/HYSTROSCOPY WITH NOVASURE ABLATION: SHX5568

## 2015-04-20 LAB — PREGNANCY, URINE: Preg Test, Ur: NEGATIVE

## 2015-04-20 SURGERY — DILATATION & CURETTAGE/HYSTEROSCOPY WITH NOVASURE ABLATION
Anesthesia: General | Site: Vagina

## 2015-04-20 MED ORDER — PROMETHAZINE HCL 25 MG/ML IJ SOLN: 6.2500 mg | INTRAMUSCULAR | Status: DC | PRN

## 2015-04-20 MED ORDER — DEXAMETHASONE SODIUM PHOSPHATE 4 MG/ML IJ SOLN
INTRAMUSCULAR | Status: AC
  Filled 2015-04-20: qty 1

## 2015-04-20 MED ORDER — FENTANYL CITRATE (PF) 100 MCG/2ML IJ SOLN
INTRAMUSCULAR | Status: AC
  Filled 2015-04-20: qty 4

## 2015-04-20 MED ORDER — PROPOFOL 10 MG/ML IV BOLUS
INTRAVENOUS | Status: DC | PRN
  Administered 2015-04-20: 200 mg via INTRAVENOUS

## 2015-04-20 MED ORDER — LACTATED RINGERS IV SOLN
INTRAVENOUS | Status: DC | PRN
  Administered 2015-04-20: 2000 mL via INTRAUTERINE

## 2015-04-20 MED ORDER — OXYCODONE-ACETAMINOPHEN 5-325 MG PO TABS: 1.0000 | ORAL_TABLET | Freq: Once | ORAL | Status: DC

## 2015-04-20 MED ORDER — OXYCODONE-ACETAMINOPHEN 5-325 MG PO TABS
ORAL_TABLET | ORAL | Status: AC
  Administered 2015-04-20: 1 via OROMUCOSAL
  Filled 2015-04-20: qty 1

## 2015-04-20 MED ORDER — ONDANSETRON HCL 4 MG/2ML IJ SOLN
INTRAMUSCULAR | Status: DC | PRN
  Administered 2015-04-20: 4 mg via INTRAVENOUS

## 2015-04-20 MED ORDER — LIDOCAINE HCL (CARDIAC) 20 MG/ML IV SOLN
INTRAVENOUS | Status: DC | PRN
  Administered 2015-04-20: 100 mg via INTRAVENOUS

## 2015-04-20 MED ORDER — SCOPOLAMINE 1 MG/3DAYS TD PT72
MEDICATED_PATCH | TRANSDERMAL | Status: AC
  Administered 2015-04-20: 1.5 mg via TRANSDERMAL
  Filled 2015-04-20: qty 1

## 2015-04-20 MED ORDER — SCOPOLAMINE 1 MG/3DAYS TD PT72
1.0000 | MEDICATED_PATCH | Freq: Once | TRANSDERMAL | Status: DC
  Administered 2015-04-20: 1.5 mg via TRANSDERMAL

## 2015-04-20 MED ORDER — FENTANYL CITRATE (PF) 100 MCG/2ML IJ SOLN
INTRAMUSCULAR | Status: DC | PRN
  Administered 2015-04-20 (×2): 25 ug via INTRAVENOUS
  Administered 2015-04-20: 50 ug via INTRAVENOUS

## 2015-04-20 MED ORDER — FENTANYL CITRATE (PF) 100 MCG/2ML IJ SOLN
25.0000 ug | INTRAMUSCULAR | Status: DC | PRN
  Administered 2015-04-20: 50 ug via INTRAVENOUS
  Administered 2015-04-20 (×2): 25 ug via INTRAVENOUS

## 2015-04-20 MED ORDER — LIDOCAINE HCL (CARDIAC) 20 MG/ML IV SOLN
INTRAVENOUS | Status: AC
  Filled 2015-04-20: qty 5

## 2015-04-20 MED ORDER — DEXAMETHASONE SODIUM PHOSPHATE 4 MG/ML IJ SOLN
INTRAMUSCULAR | Status: DC | PRN
  Administered 2015-04-20: 4 mg via INTRAVENOUS

## 2015-04-20 MED ORDER — MIDAZOLAM HCL 2 MG/2ML IJ SOLN
INTRAMUSCULAR | Status: AC
  Filled 2015-04-20: qty 4

## 2015-04-20 MED ORDER — BUPIVACAINE HCL (PF) 0.5 % IJ SOLN
INTRAMUSCULAR | Status: DC | PRN
  Administered 2015-04-20: 20 mL

## 2015-04-20 MED ORDER — BUPIVACAINE HCL (PF) 0.5 % IJ SOLN
INTRAMUSCULAR | Status: AC
  Filled 2015-04-20: qty 30

## 2015-04-20 MED ORDER — FENTANYL CITRATE (PF) 100 MCG/2ML IJ SOLN
INTRAMUSCULAR | Status: AC
  Administered 2015-04-20: 25 ug via INTRAVENOUS
  Filled 2015-04-20: qty 2

## 2015-04-20 MED ORDER — ONDANSETRON HCL 4 MG/2ML IJ SOLN
INTRAMUSCULAR | Status: AC
  Filled 2015-04-20: qty 2

## 2015-04-20 MED ORDER — MIDAZOLAM HCL 2 MG/2ML IJ SOLN
INTRAMUSCULAR | Status: DC | PRN
Start: 2015-04-20 — End: 2015-04-20
  Administered 2015-04-20: 2 mg via INTRAVENOUS

## 2015-04-20 MED ORDER — LACTATED RINGERS IV SOLN: INTRAVENOUS | Status: DC

## 2015-04-20 MED ORDER — PROPOFOL 10 MG/ML IV BOLUS
INTRAVENOUS | Status: AC
  Filled 2015-04-20: qty 20

## 2015-04-20 MED ORDER — LACTATED RINGERS IV SOLN
INTRAVENOUS | Status: DC
  Administered 2015-04-20 (×3): via INTRAVENOUS

## 2015-04-20 MED ORDER — MEPERIDINE HCL 25 MG/ML IJ SOLN: 6.2500 mg | INTRAMUSCULAR | Status: DC | PRN

## 2015-04-20 SURGICAL SUPPLY — 16 items
ABLATOR ENDOMETRIAL BIPOLAR (ABLATOR) ×2 IMPLANT
CATH ROBINSON RED A/P 16FR (CATHETERS) ×2 IMPLANT
CLOTH BEACON ORANGE TIMEOUT ST (SAFETY) ×2 IMPLANT
CONTAINER PREFILL 10% NBF 60ML (FORM) ×4 IMPLANT
DEVICE MYOSURE LITE (MISCELLANEOUS) ×2 IMPLANT
GLOVE BIO SURGEON STRL SZ7 (GLOVE) ×2 IMPLANT
GLOVE BIOGEL PI IND STRL 7.0 (GLOVE) ×1 IMPLANT
GLOVE BIOGEL PI INDICATOR 7.0 (GLOVE) ×1
GOWN STRL REUS W/TWL LRG LVL3 (GOWN DISPOSABLE) ×4 IMPLANT
GOWN STRL REUS W/TWL XL LVL3 (GOWN DISPOSABLE) ×2 IMPLANT
PACK VAGINAL MINOR WOMEN LF (CUSTOM PROCEDURE TRAY) ×2 IMPLANT
PAD OB MATERNITY 4.3X12.25 (PERSONAL CARE ITEMS) ×2 IMPLANT
TOWEL OR 17X24 6PK STRL BLUE (TOWEL DISPOSABLE) ×4 IMPLANT
TUBING AQUILEX INFLOW (TUBING) ×2 IMPLANT
TUBING AQUILEX OUTFLOW (TUBING) ×2 IMPLANT
WATER STERILE IRR 1000ML POUR (IV SOLUTION) ×2 IMPLANT

## 2015-04-20 NOTE — Discharge Instructions (Signed)
Hysteroscopy, Care After °Refer to this sheet in the next few weeks. These instructions provide you with information on caring for yourself after your procedure. Your health care provider may also give you more specific instructions. Your treatment has been planned according to current medical practices, but problems sometimes occur. Call your health care provider if you have any problems or questions after your procedure.  °WHAT TO EXPECT AFTER THE PROCEDURE °After your procedure, it is typical to have the following: °· You may have some cramping. This normally lasts for a couple days. °· You may have bleeding. This can vary from light spotting for a few days to menstrual-like bleeding for 3-7 days. °HOME CARE INSTRUCTIONS °· Rest for the first 1-2 days after the procedure. °· Only take over-the-counter or prescription medicines as directed by your health care provider. Do not take aspirin. It can increase the chances of bleeding. °· Take showers instead of baths for 2 weeks or as directed by your health care provider. °· Do not drive for 24 hours or as directed. °· Do not drink alcohol while taking pain medicine. °· Do not use tampons, douche, or have sexual intercourse for 2 weeks or until your health care provider says it is okay. °· Take your temperature twice a day for 4-5 days. Write it down each time. °· Follow your health care provider's advice about diet, exercise, and lifting. °· If you develop constipation, you may: °¨ Take a mild laxative if your health care provider approves. °¨ Add bran foods to your diet. °¨ Drink enough fluids to keep your urine clear or pale yellow. °· Try to have someone with you or available to you for the first 24-48 hours, especially if you were given a general anesthetic. °· Follow up with your health care provider as directed. °SEEK MEDICAL CARE IF: °· You feel dizzy or lightheaded. °· You feel sick to your stomach (nauseous). °· You have abnormal vaginal discharge. °· You  have a rash. °· You have pain that is not controlled with medicine. °SEEK IMMEDIATE MEDICAL CARE IF: °· You have bleeding that is heavier than a normal menstrual period. °· You have a fever. °· You have increasing cramps or pain, not controlled with medicine. °· You have new belly (abdominal) pain. °· You pass out. °· You have pain in the tops of your shoulders (shoulder strap areas). °· You have shortness of breath. °Document Released: 06/18/2013 Document Reviewed: 06/18/2013 °[employer]® Patient Information ©2015 [employer]. This information is not intended to replace advice given to you by your health care provider. Make sure you discuss any questions you have with your health care provider. ° °

## 2015-04-20 NOTE — Transfer of Care (Signed)
Immediate Anesthesia Transfer of Care Note  Patient: [name] [name]  Procedure(s) Performed: Procedure(s): DILATATION & CURETTAGE/HYSTEROSCOPY WITH NOVASURE ABLATION/RESECTION FIBROID WITH MYOSURE (N/A)  Patient Location: PACU  Anesthesia Type:General  Level of Consciousness: awake, alert  and oriented  Airway & Oxygen Therapy: Patient Spontanous Breathing and Patient connected to nasal cannula oxygen  Post-op Assessment: Report given to RN and Post -op Vital signs reviewed and stable  Post vital signs: Reviewed and stable  Last Vitals:  Filed Vitals:   04/20/15 1040  BP:   Pulse:   Temp: 37 C  Resp:     Complications: No apparent anesthesia complications

## 2015-04-20 NOTE — Op Note (Signed)
04/20/2015  12:24 PM  PATIENT:  [name] [name]  44 y.o. female  PRE-OPERATIVE DIAGNOSIS:  abnormal uterine bleeding  POST-OPERATIVE DIAGNOSIS:  abnormal uterine bleeding  PROCEDURE:  Procedure(s): DILATATION & CURETTAGE/HYSTEROSCOPY WITH NOVASURE ABLATION/RESECTION FIBROID WITH MYOSURE (N/A); removal of LnIUD  SURGEON:  Surgeon(s) and Role:    * Lavonia Drafts, MD - Primary    * Gwynne Edinger, MD - Fellow  ANESTHESIA:   general and paracervical block  EBL:  Total I/O In: 1350 [I.V.:1350] Out: 100 [Urine:100]  BLOOD ADMINISTERED:none  DRAINS: none   LOCAL MEDICATIONS USED:  MARCAINE     SPECIMEN:  Source of Specimen:  endometrial currettings and fibroid  DISPOSITION OF SPECIMEN:  PATHOLOGY  COUNTS:  YES  TOURNIQUET:  * No tourniquets in log *  DICTATION: .Note written in EPIC  PLAN OF CARE: Discharge to home after PACU  PATIENT DISPOSITION:  PACU - hemodynamically stable.   Delay start of Pharmacological VTE agent (>24hrs) due to surgical blood loss or risk of bleeding: not applicable  Complications: none immediate  Indications: 44 yo AA female with AUB not responsive to conservative measures.  The risks, benefits, and alternatives of surgery were explained, understood, and accepted. The consents were signed and all questions were answered. She was taken to the operating room and general anesthesia was applied without complication. She was placed in the dorsal lithotomy position and her vagina and abdomen were prepped and draped in the usual sterile fashion. A bimanual exam revealed a normal size and shape anteverted mobile uterus. Her adnexa were non-enlarged. Her IUD was removed. A bivalved speculum was placed in the patients' vagina and the anterior lip of the cervix was grasped with a single toothed tenaculum. A paracervical block was performed at 5 and 7 o'clock with 20cc of 0.5% Marcaine.   The endometrial cavity was sounded to 9cm and the  endocervical length measured 3cm. A hysteroscope was inserted and the endometrium was noted to have a small submucosal fibroid anteriorly and to the left.  The ostia on both sides were noted.  The scope was removed and a sharp currete was used to scape the lining of the uterus until a gritty texture was noted throughout.  Specimens were sent to pathology. The cervix was dilated manually with metal dilators to accommodate the Myosure operative hysteroscope.  Once the cervix was dilated, the hysteroscope was inserted under direct visualization using LR as a suspension medium.  The fibroid was resected using the Myosure device.  After further careful visualization of the uterine cavity, the hysteroscope was removed under direct visualization and the NovaSure device was then inserted and seated using 6.0cm as the cavity length and 4.0cm as the cavity width.  The total activation time was 90 sec at a power of 120.  The hysteroscope was reinserted and an even burn pattern was noted to the fundus.  The single toothed tenaculum was removed at the end of the case and no bleeding was noted from the cervix.   The patient was extubated and taken to the recovery room in stable condition.  Sponge, lap and instrument counts were correct.    Vannia Pola L. Harraway-Smith, M.D., [facility]

## 2015-04-20 NOTE — Anesthesia Postprocedure Evaluation (Signed)
  Anesthesia Post-op Note  Patient: DEMIAH GULLICKSON  Procedure(s) Performed: Procedure(s): DILATATION & CURETTAGE/HYSTEROSCOPY WITH NOVASURE ABLATION/RESECTION FIBROID WITH MYOSURE (N/A)  Patient Location: PACU  Anesthesia Type:General  Level of Consciousness: awake and alert   Airway and Oxygen Therapy: Patient Spontanous Breathing  Post-op Pain: mild  Post-op Assessment: Post-op Vital signs reviewed and Patient's Cardiovascular Status Stable              Post-op Vital Signs: Reviewed and stable  Last Vitals:  Filed Vitals:   04/20/15 1300  BP:   Pulse: 56  Temp:   Resp: 20    Complications: No apparent anesthesia complications

## 2015-04-20 NOTE — Anesthesia Procedure Notes (Addendum)
Procedure Name: LMA Insertion Date/Time: 04/20/2015 10:38 AM Performed by: Brock Ra Pre-anesthesia Checklist: Patient identified, Emergency Drugs available, Suction available, Patient being monitored and Timeout performed Patient Re-evaluated:Patient Re-evaluated prior to inductionOxygen Delivery Method: Circle system utilized Preoxygenation: Pre-oxygenation with 100% oxygen Intubation Type: IV induction Ventilation: Mask ventilation without difficulty LMA: LMA inserted LMA Size: 4.0 Number of attempts: 1 Placement Confirmation: positive ETCO2 and breath sounds checked- equal and bilateral Tube secured with: Tape Dental Injury: Teeth and Oropharynx as per pre-operative assessment

## 2015-04-20 NOTE — Brief Op Note (Addendum)
04/20/2015  12:24 PM  PATIENT:  [name] [name]  44 y.o. female  PRE-OPERATIVE DIAGNOSIS:  abnormal uterine bleeding  POST-OPERATIVE DIAGNOSIS:  abnormal uterine bleeding  PROCEDURE:  Procedure(s): DILATATION & CURETTAGE/HYSTEROSCOPY WITH NOVASURE ABLATION/RESECTION FIBROID WITH MYOSURE (N/A); removal of Intrauterine device  SURGEON:  Surgeon(s) and Role:    * Lavonia Drafts, MD - Primary    * Gwynne Edinger, MD - Fellow  ANESTHESIA:   general and paracervical block  EBL:  Total I/O In: 1350 [I.V.:1350] Out: 100 [Urine:100]  BLOOD ADMINISTERED:none  DRAINS: none   LOCAL MEDICATIONS USED:  MARCAINE     SPECIMEN:  Source of Specimen:  endometrial currettings and fibroid  DISPOSITION OF SPECIMEN:  PATHOLOGY  COUNTS:  YES  TOURNIQUET:  * No tourniquets in log *  DICTATION: .Note written in EPIC  PLAN OF CARE: Discharge to home after PACU  PATIENT DISPOSITION:  PACU - hemodynamically stable.   Delay start of Pharmacological VTE agent (>24hrs) due to surgical blood loss or risk of bleeding: not applicable  Complications: none immediate

## 2015-04-20 NOTE — H&P (Signed)
Preoperative History and Physical  [name] [name] is a 44 y.o. [id number] here for surgical management of AUB.  Pt reports that she is still bleeding heavily.  She had 2 cycles in July.     Proposed surgery: hysteroscopy with endometrial ablation  Past Medical History  Diagnosis Date  . Anemia   . Blood transfusion without reported diagnosis   . Pericarditis 2004-2007    Reoccuring: without flare since 2007  . Arthritis     Myleofibrosis- bone marrow deteriorating  . Shortness of breath dyspnea     on exertion when having flare up of mylofibrosis or iron deficient   Past Surgical History  Procedure Laterality Date  . Cesarean section      x1  . Great toe arthrodesis, interphalangeal joint  2011    left  . Port a cath revision      insertion-[facility]  . Lipoma excision Right 06/26/2014    Procedure: EXCISION RIGHT AXILLA  LIPOMA;  Surgeon: Doreen Salvage, MD;  Location: [place];  Service: General;  Laterality: Right;  . Port-a-cath removal Right 06/26/2014    Procedure: REMOVAL PORT-A-CATH;  Surgeon: Doreen Salvage, MD;  Location: [place];  Service: General;  Laterality: Right;   OB History    Gravida Para Term Preterm AB TAB SAB Ectopic Multiple Living   3 2 2  1  1   2      Patient denies any cervical dysplasia or STIs. Prescriptions prior to admission  Medication Sig Dispense Refill Last Dose  . folic acid (FOLVITE) 1 MG tablet Take 1 mg by mouth daily.   Taking  . hydroxychloroquine (PLAQUENIL) 200 MG tablet Take 400 mg by mouth daily.   Taking  . Methotrexate, PF, 25 MG/0.4ML SOAJ Inject 1 Dose into the skin. Patients does her shot eveyr sunday   Taking  . predniSONE (DELTASONE) 5 MG tablet Take 3 mg by mouth daily with breakfast.   Taking  . Acetaminophen-Guaifenesin 650-400 MG TABS Take by mouth.   Taking  . ferrous sulfate 325 (65 FE) MG tablet Take 325 mg by mouth 3 (three) times daily with meals.     Taking  . HYDROcodone-acetaminophen  (NORCO/VICODIN) 5-325 MG per tablet Take 1-2 tablets by mouth every 4 (four) hours as needed for moderate pain. 30 tablet 0 Taking  . multivitamin-iron-minerals-folic acid (THERAPEUTIC-M) TABS tablet Take 1 tablet by mouth daily.     Marland Kitchen oxyCODONE (OXY IR/ROXICODONE) 5 MG immediate release tablet Take by mouth.       Allergies  Allergen Reactions  . Coconut Fatty Acids     Coconut in raw form  . Aspirin    Social History:   reports that she has never smoked. She has never used smokeless tobacco. She reports that she does not drink alcohol or use illicit drugs. Family History  Problem Relation Age of Onset  . Diabetes Mother   . Varicose Veins Mother   . Diabetes Paternal Uncle   . Hypertension Paternal Uncle   . Cancer Maternal Grandmother     Review of Systems: Noncontributory  PHYSICAL EXAM: BP 116/72 mmHg  Pulse 71  Temp(Src) 98.6 F (37 C)  Resp 18  SpO2 100%  LMP 04/09/2015  Last menstrual period 04/09/2015. General appearance - alert, well appearing, and in no distress Chest - clear to auscultation, no wheezes, rales or rhonchi, symmetric air entry Heart - normal rate and regular rhythm Abdomen - soft, nontender, nondistended, no masses or organomegaly Pelvic -  examination not indicated Extremities - peripheral pulses normal, no pedal edema, no clubbing or cyanosis  Labs: Results for orders placed or performed in visit on 04/15/15 (from the past 336 hour(s))  CBC with Differential [facility])   Collection Time: 04/15/15  9:33 AM  Result Value Ref Range   WBC 3.9 3.9 - 10.0 10e3/uL   RBC 3.82 3.70 - 5.32 10e6/uL   HGB 11.7 11.6 - 15.9 g/dL   HCT 35.0 34.8 - 46.6 %   MCV 92 81 - 101 fL   MCH 30.6 26.0 - 34.0 pg   MCHC 33.4 32.0 - 36.0 g/dL   RDW 12.2 11.1 - 15.7 %   Platelets 311 145 - 400 10e3/uL   NEUT# 1.9 1.5 - 6.5 10e3/uL   LYMPH# 1.5 0.9 - 3.3 10e3/uL   MONO# 0.3 0.1 - 0.9 10e3/uL   Eosinophils Absolute 0.2 0.0 - 0.5 10e3/uL   BASO# 0.0 0.0 - 0.2  10e3/uL   NEUT% 49.1 39.6 - 80.0 %   LYMPH% 39.0 14.0 - 48.0 %   MONO% 7.0 0.0 - 13.0 %   EOS% 4.4 0.0 - 7.0 %   BASO% 0.5 0.0 - 2.0 %  Lactate dehydrogenase   Collection Time: 04/15/15  9:33 AM  Result Value Ref Range   LDH 96 94 - 250 U/L  Iron and TIBC   Collection Time: 04/15/15  9:33 AM  Result Value Ref Range   Iron 108 41 - 142 ug/dL   TIBC 243 236 - 444 ug/dL   UIBC 135 120 - 384 ug/dL   %SAT 44 21 - 57 %  [facility] - Smear   Collection Time: 04/15/15  9:33 AM  Result Value Ref Range   Smear Result Smear Available   Ferritin   Collection Time: 04/15/15  9:33 AM  Result Value Ref Range   Ferritin 49 9 - 269 ng/ml  Reticulocyte Count (SLN)   Collection Time: 04/15/15  9:33 AM  Result Value Ref Range   Retic Ct Pct 0.9 0.4 - 2.3 %   RBC. 3.88 3.87 - 5.11 MIL/uL   ABS Retic 34.9 19.0 - 186.0 K/uL  COMPREHENSIVE METABOLIC PANEL ([facility] REFLEX ONLY)   Collection Time: 04/15/15  9:33 AM  Result Value Ref Range   Sodium 140 128 - 145 mEq/L   Potassium 4.4 3.3 - 4.7 mEq/L   Chloride 108 98 - 108 mEq/L   CO2 27 18 - 33 mEq/L   Glucose, Bld 89 73 - 118 mg/dL   BUN, Bld 15 7 - 22 mg/dL   Creat 1.0 0.6 - 1.2 mg/dl   Total Bilirubin 0.90 0.20 - 1.60 mg/dl   Alkaline Phosphatase 38 26 - 84 U/L   AST 19 11 - 38 U/L   ALT(SGPT) 15 10 - 47 U/L   Total Protein 6.7 6.4 - 8.1 g/dL   Albumin 3.6 3.3 - 5.5 g/dL   Calcium 9.2 8.0 - 10.3 mg/dL  Results for orders placed or performed during the hospital encounter of 04/14/15 (from the past 336 hour(s))  CBC   Collection Time: 04/14/15 10:35 AM  Result Value Ref Range   WBC 4.3 4.0 - 10.5 K/uL   RBC 3.95 3.87 - 5.11 MIL/uL   Hemoglobin 11.9 (L) 12.0 - 15.0 g/dL   HCT 35.7 (L) 36.0 - 46.0 %   MCV 90.4 78.0 - 100.0 fL   MCH 30.1 26.0 - 34.0 pg   MCHC 33.3 30.0 - 36.0 g/dL   RDW  12.7 11.5 - 15.5 %   Platelets 312 150 - 400 K/uL    Imaging Studies: 03/09/2015 CLINICAL DATA: Abnormal uterine bleeding for 1 year.  IUD  EXAM: TRANSABDOMINAL AND TRANSVAGINAL ULTRASOUND OF PELVIS  TECHNIQUE: Both transabdominal and transvaginal ultrasound examinations of the pelvis were performed. Transabdominal technique was performed for global imaging of the pelvis including uterus, ovaries, adnexal regions, and pelvic cul-de-sac. It was necessary to proceed with endovaginal exam following the transabdominal exam to visualize the endometrium and ovaries.  COMPARISON: 08/05/2013  FINDINGS: Uterus  Measurements: 9.5 x 5.1 x 6.4 cm. No fibroids or other mass visualized.  Endometrium  Thickness: 5 mm in thickness. IUD in place, located within the lower uterine segment and cervical region. No focal abnormality visualized.  Right ovary  Measurements: 2.4 x 1.8 x 1.9 cm. Normal appearance/no adnexal mass.  Left ovary  Measurements: 3.7 x 2.0 x 2.8 cm. Normal appearance/no adnexal mass.  Other findings  No free fluid.  IMPRESSION: IUD located within the lower uterine segment and cervical regions.  No acute findings. Assessment: Patient Active Problem List   Diagnosis Date Noted  . Myelofibrosis 08/13/2013  . Lipoma of axilla 08/12/2013  . Anemia, iron deficiency 07/11/2011    Plan: Patient will undergo surgical management with hysteroscopy with endometrial ablation.   The risks of surgery were discussed in detail with the patient including but not limited to: bleeding which may require transfusion or reoperation; infection which may require antibiotics; injury to surrounding organs which may involve bowel, bladder, perforation of the uterus; need for additional procedures including laparoscopy or laparotomy; thromboembolic phenomenon, surgical site problems and other postoperative/anesthesia complications. Likelihood of success in alleviating the patient's condition was discussed. Routine postoperative instructions will be reviewed with the patient and her family in detail after  surgery.  The patient concurred with the proposed plan, giving informed written consent for the surgery.  Patient has been NPO since last night she will remain NPO for procedure.  Anesthesia and OR aware.  Preoperative prophylactic antibiotics and SCDs ordered on call to the OR.  To OR when ready.  Anieya Helman L. Harraway-Smith, M.D., [facility] 04/20/2015 10:29 AM

## 2015-04-21 ENCOUNTER — Encounter (HOSPITAL_COMMUNITY): Payer: Self-pay | Admitting: Obstetrics & Gynecology

## 2015-04-21 LAB — FOLLICLE STIMULATING HORMONE: FSH: 7.3 m[IU]/mL

## 2015-04-29 ENCOUNTER — Telehealth: Payer: Self-pay | Admitting: *Deleted

## 2015-04-29 NOTE — Telephone Encounter (Signed)
Received a message left on nurse line on 04/29/15 at 1002.  Patient states she had a procedure (DILATATION & CURETTAGE/HYSTEROSCOPY WITH NOVASURE ABLATION/RESECTION FIBROID WITH MYOSURE (N/A); removal of Intrauterine device) done and has questions about the symptoms she is experiencing.  States she is having a yellowish discharge no bleeding.  States she didn't see anything about this in her discharge instructions.  Would like a return call.

## 2015-04-30 NOTE — Telephone Encounter (Signed)
Received another message from patient left on nurse line today at 1156.  Patient states she is continuing to have some yellowish discharge and wants to know if this is normal.  Spoke with patient via phone.  Explained this discharge is normal for 2-3 weeks after this procedure.  Patient states understanding.

## 2015-05-21 ENCOUNTER — Encounter: Payer: Self-pay | Admitting: Obstetrics & Gynecology

## 2015-05-21 ENCOUNTER — Ambulatory Visit: Payer: Medicare Other | Admitting: Obstetrics & Gynecology

## 2015-05-21 VITALS — BP 112/69 | HR 58 | Temp 98.5°F | Ht 66.0 in | Wt 190.1 lb

## 2015-05-21 DIAGNOSIS — Z9889 Other specified postprocedural states: Secondary | ICD-10-CM

## 2015-05-21 DIAGNOSIS — Z30011 Encounter for initial prescription of contraceptive pills: Secondary | ICD-10-CM

## 2015-05-21 MED ORDER — LEVONORGESTREL-ETHINYL ESTRAD 0.1-20 MG-MCG PO TABS: 1.0000 | ORAL_TABLET | Freq: Every day | ORAL | Status: DC

## 2015-05-21 NOTE — Patient Instructions (Signed)
Oral Contraception Use Oral contraceptive pills (OCPs) are medicines taken to prevent pregnancy. OCPs work by preventing the ovaries from releasing eggs. The hormones in OCPs also cause the cervical mucus to thicken, preventing the sperm from entering the uterus. The hormones also cause the uterine lining to become thin, not allowing a fertilized egg to attach to the inside of the uterus. OCPs are highly effective when taken exactly as prescribed. However, OCPs do not prevent sexually transmitted diseases (STDs). Safe sex practices, such as using condoms along with an OCP, can help prevent STDs. Before taking OCPs, you may have a physical exam and Pap test. Your health care provider may also order blood tests if necessary. Your health care provider will make sure you are a good candidate for oral contraception. Discuss with your health care provider the possible side effects of the OCP you may be prescribed. When starting an OCP, it can take 2 to 3 months for the body to adjust to the changes in hormone levels in your body.  HOW TO TAKE ORAL CONTRACEPTIVE PILLS Your health care provider may advise you on how to start taking the first cycle of OCPs. Otherwise, you can:   Start on day 1 of your menstrual period. You will not need any backup contraceptive protection with this start time.   Start on the first Sunday after your menstrual period or the day you get your prescription. In these cases, you will need to use backup contraceptive protection for the first week.   Start the pill at any time of your cycle. If you take the pill within 5 days of the start of your period, you are protected against pregnancy right away. In this case, you will not need a backup form of birth control. If you start at any other time of your menstrual cycle, you will need to use another form of birth control for 7 days. If your OCP is the type called a minipill, it will protect you from pregnancy after taking it for 2 days (48  hours). After you have started taking OCPs:   If you forget to take 1 pill, take it as soon as you remember. Take the next pill at the regular time.   If you miss 2 or more pills, call your health care provider because different pills have different instructions for missed doses. Use backup birth control until your next menstrual period starts.   If you use a 28-day pack that contains inactive pills and you miss 1 of the last 7 pills (pills with no hormones), it will not matter. Throw away the rest of the non-hormone pills and start a new pill pack.  No matter which day you start the OCP, you will always start a new pack on that same day of the week. Have an extra pack of OCPs and a backup contraceptive method available in case you miss some pills or lose your OCP pack.  HOME CARE INSTRUCTIONS   Do not smoke.   Always use a condom to protect against STDs. OCPs do not protect against STDs.   Use a calendar to mark your menstrual period days.   Read the information and directions that came with your OCP. Talk to your health care provider if you have questions.  SEEK MEDICAL CARE IF:   You develop nausea and vomiting.   You have abnormal vaginal discharge or bleeding.   You develop a rash.   You miss your menstrual period.   You are losing   your hair.   You need treatment for mood swings or depression.   You get dizzy when taking the OCP.   You develop acne from taking the OCP.   You become pregnant.  SEEK IMMEDIATE MEDICAL CARE IF:   You develop chest pain.   You develop shortness of breath.   You have an uncontrolled or severe headache.   You develop numbness or slurred speech.   You develop visual problems.   You develop pain, redness, and swelling in the legs.  Document Released: 08/17/2011 Document Revised: 01/12/2014 Document Reviewed: 02/16/2013 [employer] Patient Information 2015 [employer]. This information is not intended to replace  advice given to you by your health care provider. Make sure you discuss any questions you have with your health care provider.  

## 2015-05-21 NOTE — Progress Notes (Signed)
Patient ID: [name] [name], female   DOB: 1970/09/30, 44 y.o.   MRN: [id number] History:  44 y.o. [id number] here today for 4 week post op check. She is s/p Endometrial ablation and removal of LnIUD.  She denis abnorma lsx and no bleeding since her IUD removal and procedure.    The following portions of the patient's history were reviewed and updated as appropriate: allergies, current medications, past family history, past medical history, past social history, past surgical history and problem list.  Review of Systems:  Pertinent items are noted in HPI.  Objective:  Physical Exam Blood pressure 112/69, pulse 58, temperature 98.5 F (36.9 C), height 5\' 6"  (1.676 m), weight 190 lb 1.6 oz (86.229 kg). Gen: NAD Exam deferred  04/2015 [name] [name] ~7  Assessment & Plan:  Post op check- pt doing well.  Reviewed suspected post op course  Contraception counseling.  Reviewed options with pt.  She declines further LARC.  No contraindications to OCPs which is pts preferred option.    Aviane 1 po q day for contraception (this is generic for Loestrin 1/20) F/u 3 months  Danielle Mink L. Harraway-Smith, M.D., Cherlynn June

## 2015-06-29 DIAGNOSIS — L910 Hypertrophic scar: Secondary | ICD-10-CM | POA: Diagnosis not present

## 2015-07-05 ENCOUNTER — Other Ambulatory Visit: Payer: Self-pay

## 2015-07-05 DIAGNOSIS — Z1231 Encounter for screening mammogram for malignant neoplasm of breast: Secondary | ICD-10-CM

## 2015-07-26 DIAGNOSIS — Z0000 Encounter for general adult medical examination without abnormal findings: Secondary | ICD-10-CM | POA: Diagnosis not present

## 2015-07-26 DIAGNOSIS — Z01419 Encounter for gynecological examination (general) (routine) without abnormal findings: Secondary | ICD-10-CM | POA: Diagnosis not present

## 2015-07-26 DIAGNOSIS — D649 Anemia, unspecified: Secondary | ICD-10-CM | POA: Diagnosis not present

## 2015-08-13 ENCOUNTER — Ambulatory Visit
Admission: RE | Admit: 2015-08-13 | Discharge: 2015-08-13 | Disposition: A | Discharge status: Home / Self Care | Payer: Medicare Other | Source: Ambulatory Visit

## 2015-08-13 DIAGNOSIS — Z1231 Encounter for screening mammogram for malignant neoplasm of breast: Secondary | ICD-10-CM

## 2015-08-23 DIAGNOSIS — M359 Systemic involvement of connective tissue, unspecified: Secondary | ICD-10-CM | POA: Diagnosis not present

## 2015-08-23 DIAGNOSIS — M083 Juvenile rheumatoid polyarthritis (seronegative): Secondary | ICD-10-CM | POA: Diagnosis not present

## 2015-09-12 HISTORY — PX: BREAST EXCISIONAL BIOPSY: SUR124

## 2015-09-25 DIAGNOSIS — M545 Low back pain: Secondary | ICD-10-CM | POA: Diagnosis not present

## 2015-09-25 DIAGNOSIS — Z886 Allergy status to analgesic agent status: Secondary | ICD-10-CM | POA: Diagnosis not present

## 2015-09-25 DIAGNOSIS — Z91018 Allergy to other foods: Secondary | ICD-10-CM | POA: Diagnosis not present

## 2015-09-25 DIAGNOSIS — M549 Dorsalgia, unspecified: Secondary | ICD-10-CM | POA: Diagnosis not present

## 2015-10-18 ENCOUNTER — Ambulatory Visit (HOSPITAL_BASED_OUTPATIENT_CLINIC_OR_DEPARTMENT_OTHER): Payer: Medicare Other | Admitting: Family

## 2015-10-18 ENCOUNTER — Other Ambulatory Visit (HOSPITAL_BASED_OUTPATIENT_CLINIC_OR_DEPARTMENT_OTHER): Payer: Medicare Other

## 2015-10-18 ENCOUNTER — Encounter: Payer: Self-pay | Admitting: Family

## 2015-10-18 VITALS — BP 116/93 | HR 102 | Temp 99.2°F | Resp 18 | Ht 66.0 in | Wt 192.0 lb

## 2015-10-18 DIAGNOSIS — D509 Iron deficiency anemia, unspecified: Secondary | ICD-10-CM

## 2015-10-18 DIAGNOSIS — D7581 Myelofibrosis: Secondary | ICD-10-CM | POA: Diagnosis not present

## 2015-10-18 DIAGNOSIS — J019 Acute sinusitis, unspecified: Secondary | ICD-10-CM

## 2015-10-18 DIAGNOSIS — B9689 Other specified bacterial agents as the cause of diseases classified elsewhere: Secondary | ICD-10-CM

## 2015-10-18 LAB — IRON AND TIBC
%SAT: 29 % (ref 21–57)
Iron: 71 ug/dL (ref 41–142)
TIBC: 249 ug/dL (ref 236–444)
UIBC: 177 ug/dL (ref 120–384)

## 2015-10-18 LAB — CMP (CANCER CENTER ONLY)
ALT(SGPT): 18 U/L (ref 10–47)
AST: 23 U/L (ref 11–38)
Albumin: 3.6 g/dL (ref 3.3–5.5)
Alkaline Phosphatase: 46 U/L (ref 26–84)
BUN, Bld: 11 mg/dL (ref 7–22)
CO2: 26 mEq/L (ref 18–33)
Calcium: 9.1 mg/dL (ref 8.0–10.3)
Chloride: 105 mEq/L (ref 98–108)
Creat: 0.8 mg/dl (ref 0.6–1.2)
Glucose, Bld: 118 mg/dL (ref 73–118)
Potassium: 3.7 mEq/L (ref 3.3–4.7)
Sodium: 140 mEq/L (ref 128–145)
Total Bilirubin: 0.6 mg/dl (ref 0.20–1.60)
Total Protein: 7.3 g/dL (ref 6.4–8.1)

## 2015-10-18 LAB — CBC WITH DIFFERENTIAL (CANCER CENTER ONLY)
BASO#: 0 10*3/uL (ref 0.0–0.2)
BASO%: 0.6 % (ref 0.0–2.0)
EOS%: 2.5 % (ref 0.0–7.0)
Eosinophils Absolute: 0.1 10*3/uL (ref 0.0–0.5)
HCT: 39.8 % (ref 34.8–46.6)
HGB: 13.1 g/dL (ref 11.6–15.9)
LYMPH#: 0.9 10*3/uL (ref 0.9–3.3)
LYMPH%: 26.9 % (ref 14.0–48.0)
MCH: 29.9 pg (ref 26.0–34.0)
MCHC: 32.9 g/dL (ref 32.0–36.0)
MCV: 91 fL (ref 81–101)
MONO#: 0.4 10*3/uL (ref 0.1–0.9)
MONO%: 11.9 % (ref 0.0–13.0)
NEUT#: 1.9 10*3/uL (ref 1.5–6.5)
NEUT%: 58.1 % (ref 39.6–80.0)
Platelets: 257 10*3/uL (ref 145–400)
RBC: 4.38 10*6/uL (ref 3.70–5.32)
RDW: 11.8 % (ref 11.1–15.7)
WBC: 3.2 10*3/uL — ABNORMAL LOW (ref 3.9–10.0)

## 2015-10-18 LAB — FERRITIN: Ferritin: 76 ng/ml (ref 9–269)

## 2015-10-18 MED ORDER — AMOXICILLIN-POT CLAVULANATE 875-125 MG PO TABS: 1.0000 | ORAL_TABLET | Freq: Two times a day (BID) | ORAL | Status: DC

## 2015-10-18 NOTE — Progress Notes (Signed)
Hematology and Oncology Follow Up Visit  CHARLESTINE SKEHAN [id number] 1970-10-07 45 y.o. 10/18/2015   Principle Diagnosis:   Bone marrow fibrosis  Intermittent iron deficiency anemia  Juvenile rheumatoid arthritis  Current Therapy:   IV iron as indicated    Interim History:  Ms. Gaetz is here today for follow-up. She has a bad sinus infection that includes a productive cough with green sputum, fatigue and weakness. She states that she has had a low-grade fever her temperature at this time is 99.2. Her appetite has also been decreased while she has been set. She has been able to stay hydrated with water. Her weight is up 2 pounds since her last visit. She continues to followed by due closely and is still self injecting Remicade once a week. She uses steroids as needed for flares. Her last flare being 2 months ago. Her next follow-up with [facility] is in March. She will have an MRI of her right hip at that time since that is the area affected when she does have a flare. She has not had to have an iron infusion in over 2 years. Her iron studies in August of last year showed an iron saturation of 44% and the ferritin of 49. She denies chills, nausea, vomiting, rash, dizziness, headache, chest pain, palpitations, abdominal pain, and changes in bowel or bladder habits. While she has been sick she has noticed that she occasionally becomes short of breath with exertion while going up and down stairs. This resolved quickly and she takes a moment to rest. She states that the numbness and tingling in her hands and feet is mild, tolerable and unchanged. No swelling or tenderness in her extremities at this time.  Medications:    Medication List       This list is accurate as of: 10/18/15 12:53 PM.  Always use your most recent med list.               Acetaminophen-Guaifenesin 650-400 MG Tabs  Take by mouth.     amoxicillin-clavulanate 875-125 MG tablet  Commonly known as:  AUGMENTIN  Take 1  tablet by mouth 2 (two) times daily.     ferrous sulfate 325 (65 FE) MG tablet  Take 325 mg by mouth 3 (three) times daily with meals.     folic acid 1 MG tablet  Commonly known as:  FOLVITE  Take 1 mg by mouth daily.     HYDROcodone-acetaminophen 5-325 MG tablet  Commonly known as:  NORCO/VICODIN  Take 1-2 tablets by mouth every 4 (four) hours as needed for moderate pain.     hydroxychloroquine 200 MG tablet  Commonly known as:  PLAQUENIL  Take 400 mg by mouth daily.     levonorgestrel-ethinyl estradiol 0.1-20 MG-MCG tablet  Commonly known as:  AVIANE  Take 1 tablet by mouth daily.     Methotrexate (PF) 25 MG/0.4ML Soaj  Inject 1 Dose into the skin. Patients does her shot eveyr sunday     multivitamin-iron-minerals-folic acid Tabs tablet  Take 1 tablet by mouth daily.     oxyCODONE 5 MG immediate release tablet  Commonly known as:  Oxy IR/ROXICODONE  Take by mouth.     predniSONE 5 MG tablet  Commonly known as:  DELTASONE  Take 3 mg by mouth daily with breakfast.        Allergies:  Allergies  Allergen Reactions  . Coconut Fatty Acids     Coconut in raw form  . Aspirin  Past Medical History, Surgical history, Social history, and Family History were reviewed and updated.  Review of Systems: All other 10 point review of systems is negative.   Physical Exam:  height is 5\' 6"  (1.676 m) and weight is 192 lb (87.091 kg). Her oral temperature is 99.2 F (37.3 C). Her blood pressure is 116/93 and her pulse is 102. Her respiration is 18.   Wt Readings from Last 3 Encounters:  10/18/15 192 lb (87.091 kg)  05/21/15 190 lb 1.6 oz (86.229 kg)  04/15/15 190 lb (86.183 kg)    Ocular: Sclerae unicteric, pupils equal, round and reactive to light Ear-nose-throat: Oropharynx clear, dentition fair Lymphatic: No cervical supraclavicular or axillary adenopathy Lungs no rales or rhonchi, good excursion bilaterally Heart regular rate and rhythm, no murmur appreciated Abd  soft, nontender, positive bowel sounds, no liver or spleen tip palpated on exam MSK no focal spinal tenderness, no joint edema Neuro: non-focal, well-oriented, appropriate affect Breasts: Deferred  Lab Results  Component Value Date   WBC 3.2* 10/18/2015   HGB 13.1 10/18/2015   HCT 39.8 10/18/2015   MCV 91 10/18/2015   PLT 257 10/18/2015   Lab Results  Component Value Date   FERRITIN 49 04/15/2015   IRON 108 04/15/2015   TIBC 243 04/15/2015   UIBC 135 04/15/2015   IRONPCTSAT 44 04/15/2015   Lab Results  Component Value Date   RETICCTPCT 0.9 04/15/2015   RBC 4.38 10/18/2015   RETICCTABS 34.9 04/15/2015   No results found for: KPAFRELGTCHN, LAMBDASER, KAPLAMBRATIO No results found for: IGGSERUM, IGA, IGMSERUM No results found for: Kathrynn Ducking, MSPIKE, SPEI   Chemistry      Component Value Date/Time   NA 140 10/18/2015 0940   NA 140 10/15/2014 0906   NA 140 06/25/2014 1315   K 3.7 10/18/2015 0940   K 3.9 10/15/2014 0906   K 4.6 06/25/2014 1315   CL 105 10/18/2015 0940   CL 103 06/25/2014 1315   CO2 26 10/18/2015 0940   CO2 25 10/15/2014 0906   CO2 27 06/25/2014 1315   BUN 11 10/18/2015 0940   BUN 11.0 10/15/2014 0906   BUN 12 06/25/2014 1315   CREATININE 0.8 10/18/2015 0940   CREATININE 0.8 10/15/2014 0906   CREATININE 0.97 06/25/2014 1315      Component Value Date/Time   CALCIUM 9.1 10/18/2015 0940   CALCIUM 8.8 10/15/2014 0906   CALCIUM 9.5 06/25/2014 1315   ALKPHOS 46 10/18/2015 0940   ALKPHOS 46 10/15/2014 0906   ALKPHOS 44 06/25/2014 1315   AST 23 10/18/2015 0940   AST 19 10/15/2014 0906   AST 15 06/25/2014 1315   ALT 18 10/18/2015 0940   ALT 19 10/15/2014 0906   ALT 15 06/25/2014 1315   BILITOT 0.60 10/18/2015 0940   BILITOT 0.59 10/15/2014 0906   BILITOT 0.6 06/25/2014 1315     Impression and Plan:Ms. Ude is 45 year old African-American female with intermittent iron deficiency anemia secondary  to bone marrow fibrosis. She also has rheumatoid arthritis.  She is seen regularly by [facility] her next appointment being in March and self injects Remicade once a week. Her last flare was 2 months ago. Her last set of iron studies were stable in August 2016. We will see what her iron studies show today. Her CBC today looks good no anemia. A prescription for Augmentin for sinusitis was sent to her pharmacy. We will plan to see her back in 6 months for labs and  follow-up. She will contact us with any questions or concerns. We can certainly see her sooner if need be.  Eliezer Bottom, NP 2/6/201712:53 PM

## 2015-10-19 LAB — RETICULOCYTES: Reticulocyte Count: 0.5 % — ABNORMAL LOW (ref 0.6–2.6)

## 2015-11-22 DIAGNOSIS — R768 Other specified abnormal immunological findings in serum: Secondary | ICD-10-CM | POA: Diagnosis not present

## 2015-11-22 DIAGNOSIS — Z5181 Encounter for therapeutic drug level monitoring: Secondary | ICD-10-CM | POA: Diagnosis not present

## 2015-11-22 DIAGNOSIS — M359 Systemic involvement of connective tissue, unspecified: Secondary | ICD-10-CM | POA: Diagnosis not present

## 2015-11-22 DIAGNOSIS — M1990 Unspecified osteoarthritis, unspecified site: Secondary | ICD-10-CM | POA: Diagnosis not present

## 2016-01-12 ENCOUNTER — Telehealth: Payer: Self-pay | Admitting: *Deleted

## 2016-01-12 DIAGNOSIS — R0989 Other specified symptoms and signs involving the circulatory and respiratory systems: Secondary | ICD-10-CM | POA: Diagnosis not present

## 2016-01-12 DIAGNOSIS — J159 Unspecified bacterial pneumonia: Secondary | ICD-10-CM | POA: Diagnosis not present

## 2016-01-12 DIAGNOSIS — R509 Fever, unspecified: Secondary | ICD-10-CM | POA: Diagnosis not present

## 2016-01-12 DIAGNOSIS — J9811 Atelectasis: Secondary | ICD-10-CM | POA: Diagnosis not present

## 2016-01-12 NOTE — Telephone Encounter (Signed)
Patient c/o chest congestion. She has productive cough with green sputum. She is experiencing fevers in the 100 degree range. She is now experiencing back and rib pain when coughing. These symptoms have been present for approx 6 days despite taking OTC medications for her symptoms.   Spoke to Dr Marin Olp. We treat this patient for iron deficiency anemia and as such, the patient needs to follow up with her PCP or urgent care.  Spoke with patient and she understood instruction. She will call her PCP and if she can't be seen today, she will go to an urgent care.

## 2016-02-04 IMAGING — MR MR KNEE*L* WO/W CM
2 series · 40 of 40 positions shown · IV contrast (18 ML MULTIHANCE)
Comparison: None.

CLINICAL DATA: Bilateral knee pain.  Myelofibrosis.

EXAM:
MRI OF THE LEFT KNEE WITHOUT AND WITH CONTRAST
TECHNIQUE: Multiplanar, multisequence MR imaging of the knee was performed
before and after the administration of intravenous contrast.
CONTRAST:  18mL MULTIHANCE GADOBENATE DIMEGLUMINE 529 MG/ML IV SOLN

[Series 11: T1 post-contrast · sagittal · 4.0mm · 0.50mm/px · 20 of 29 slices shown (1 of 2)]
[im 1/29]
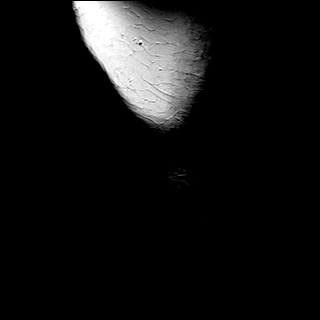
[im 2/29]
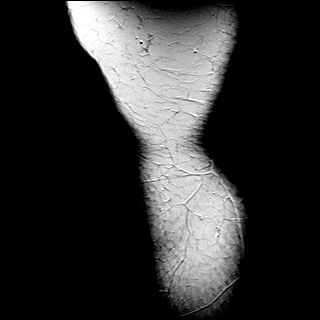
[im 3/29]
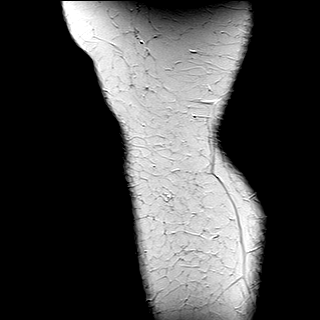
[im 5/29]
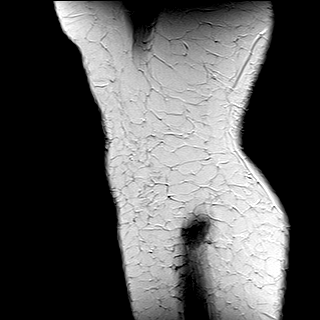
[im 6/29]
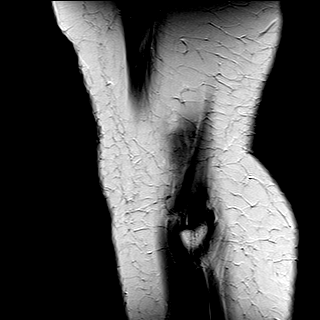
[im 8/29]
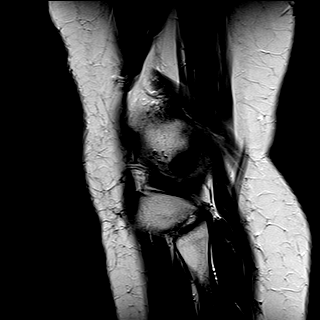
[im 9/29]
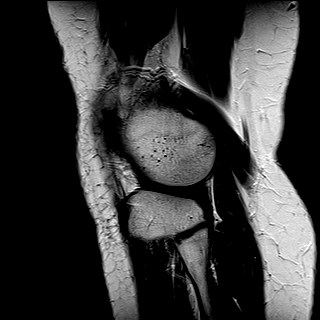
[im 11/29]
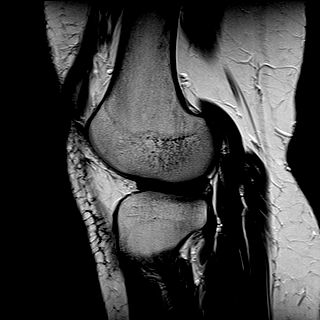
[im 12/29]
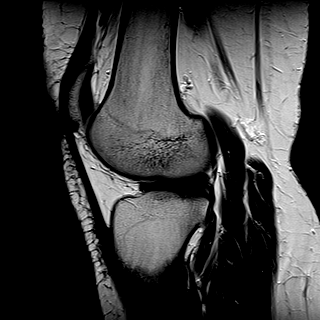
[im 14/29]
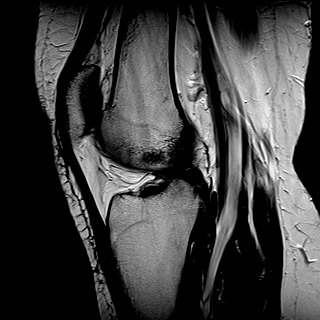
[im 15/29]
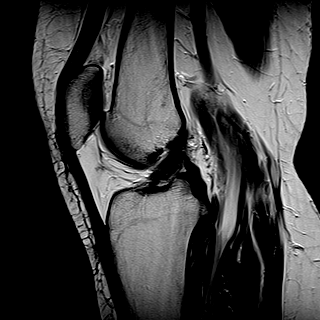
[im 17/29]
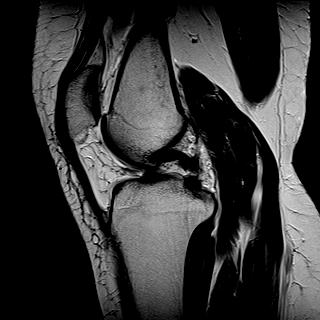
[im 18/29]
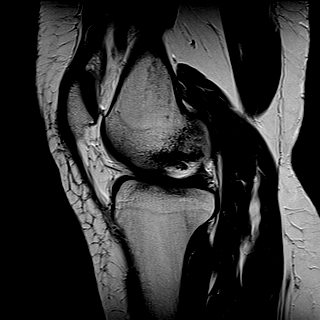
[im 20/29]
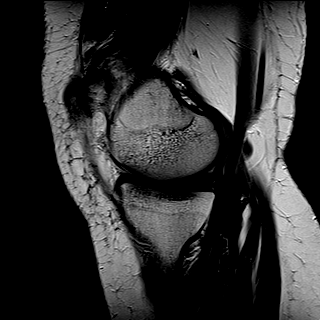
[im 21/29]
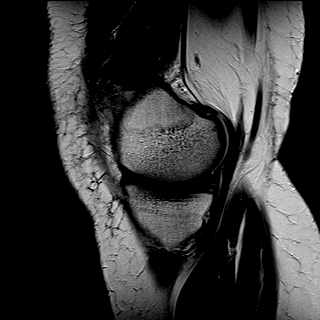
[im 23/29]
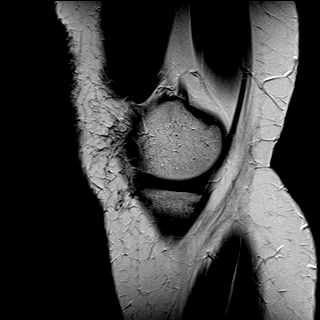
[im 24/29]
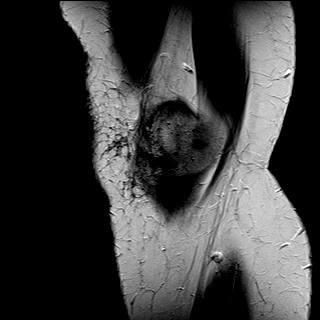
[im 26/29]
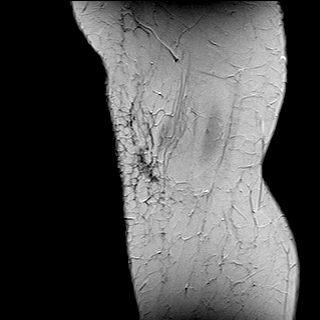
[im 27/29]
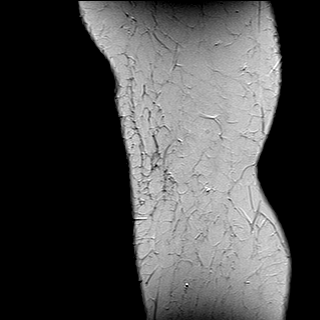
[im 29/29]
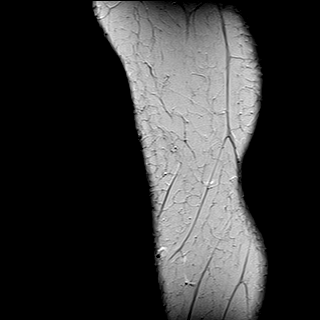

[Series 12: T1 post-contrast · coronal · 3.0mm · 0.50mm/px · 20 of 29 slices shown (2 of 2)]
[im 1/29]
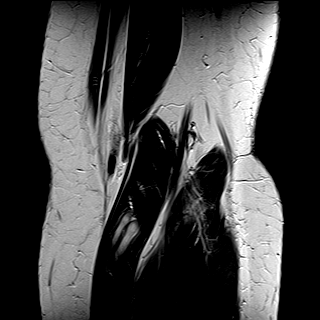
[im 2/29]
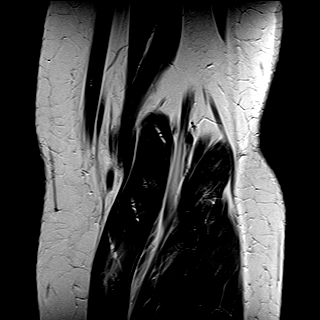
[im 3/29]
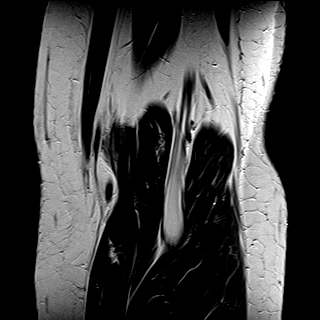
[im 5/29]
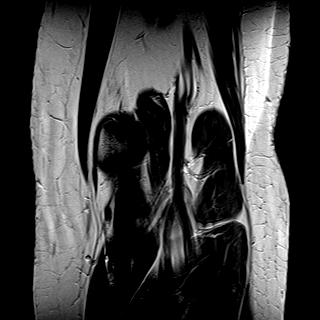
[im 6/29]
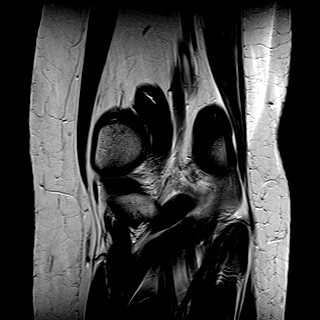
[im 8/29]
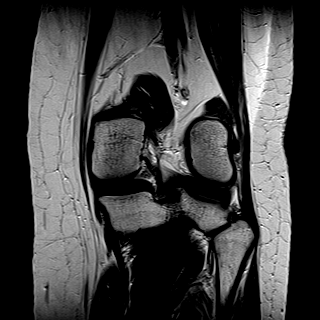
[im 9/29]
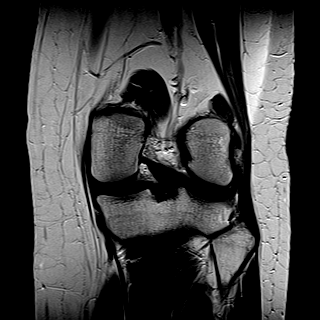
[im 11/29]
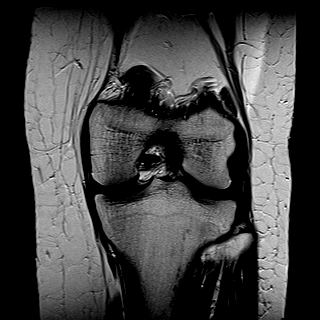
[im 12/29]
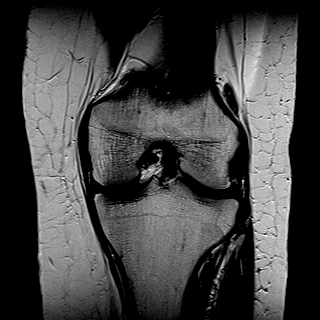
[im 14/29]
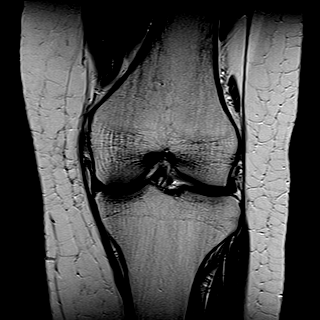
[im 15/29]
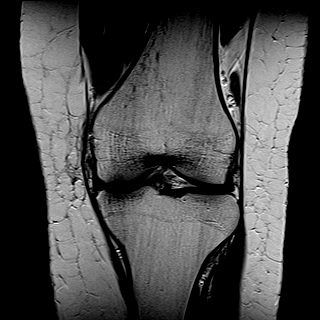
[im 17/29]
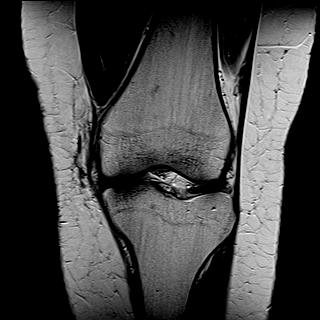
[im 18/29]
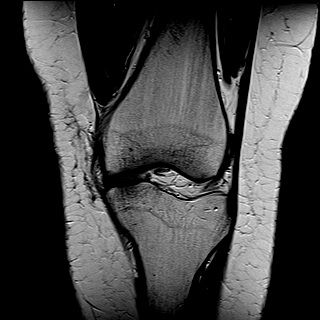
[im 20/29]
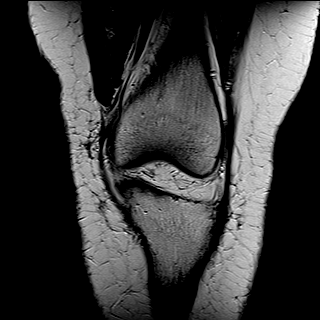
[im 21/29]
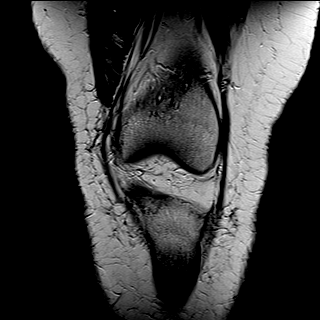
[im 23/29]
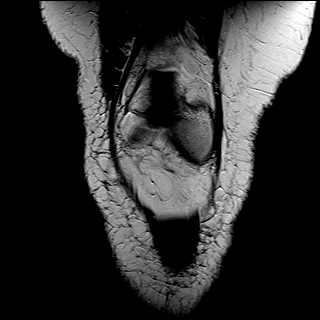
[im 24/29]
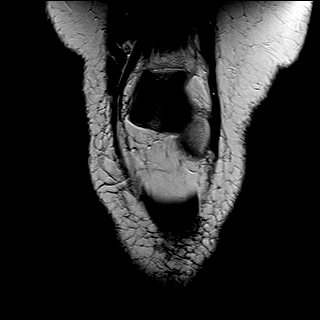
[im 26/29]
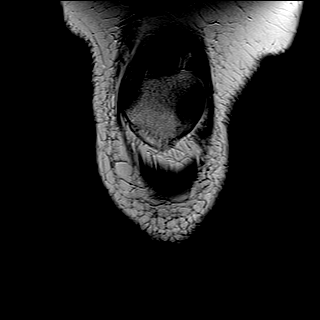
[im 27/29]
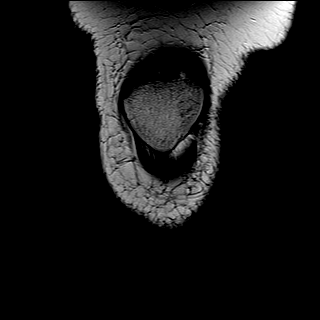
[im 29/29]
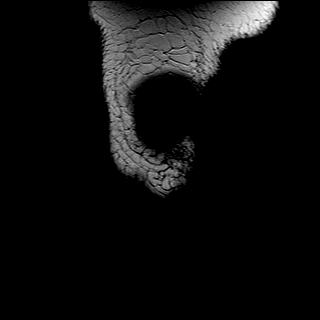

[40 of 40 positions shown; findings below may reference images not displayed]

FINDINGS: The soft tissues around the knee appear normal.

MENISCI

Medial meniscus:  Normal.

Lateral meniscus:  Normal.

LIGAMENTS

Cruciates:  Minimal mucoid degeneration of the ACL.  PCL is normal.

Collaterals:  Normal.

CARTILAGE

Patellofemoral: Slight thinning of the articular cartilage of the
medial facet and apex of the patella.

Medial:  Normal.

Lateral:  Normal.

Joint: No joint effusion. Slight edema in the distal quadriceps fat
pad adjacent to the upper pole of the patella. This is nonspecific.

Popliteal Fossa:  Very tiny Baker's cyst.

Extensor Mechanism:  Normal.

Bones: Normal. There is no evidence of fibrosis or red marrow
reactivation.
IMPRESSION: 1. Minimal chondromalacia of the patella.
2. Nonspecific slight edema in the distal quadriceps fat pad which
can be seen with impingement.
3. No evidence of marrow abnormality or abnormality of the soft
tissues around the knee.

## 2016-04-17 ENCOUNTER — Encounter: Payer: Self-pay | Admitting: Family

## 2016-04-17 ENCOUNTER — Ambulatory Visit (HOSPITAL_BASED_OUTPATIENT_CLINIC_OR_DEPARTMENT_OTHER): Payer: Medicare Other | Admitting: Family

## 2016-04-17 ENCOUNTER — Other Ambulatory Visit (HOSPITAL_BASED_OUTPATIENT_CLINIC_OR_DEPARTMENT_OTHER): Payer: Medicare Other

## 2016-04-17 VITALS — BP 112/68 | HR 61 | Temp 98.5°F | Resp 18 | Ht 66.0 in | Wt 207.0 lb

## 2016-04-17 DIAGNOSIS — D7581 Myelofibrosis: Secondary | ICD-10-CM | POA: Diagnosis not present

## 2016-04-17 DIAGNOSIS — D509 Iron deficiency anemia, unspecified: Secondary | ICD-10-CM | POA: Diagnosis not present

## 2016-04-17 LAB — COMPREHENSIVE METABOLIC PANEL
ALT: 14 U/L (ref 0–55)
AST: 14 U/L (ref 5–34)
Albumin: 3.6 g/dL (ref 3.5–5.0)
Alkaline Phosphatase: 44 U/L (ref 40–150)
Anion Gap: 8 mEq/L (ref 3–11)
BUN: 10.4 mg/dL (ref 7.0–26.0)
CO2: 22 mEq/L (ref 22–29)
Calcium: 9.2 mg/dL (ref 8.4–10.4)
Chloride: 108 mEq/L (ref 98–109)
Creatinine: 0.8 mg/dL (ref 0.6–1.1)
EGFR: >90 mL/min/{1.73_m2} (ref >90)
Glucose: 98 mg/dl (ref 70–140)
Potassium: 4.4 mEq/L (ref 3.5–5.1)
Sodium: 139 mEq/L (ref 136–145)
Total Bilirubin: 0.77 mg/dL (ref 0.20–1.20)
Total Protein: 6.9 g/dL (ref 6.4–8.3)

## 2016-04-17 LAB — CBC WITH DIFFERENTIAL (CANCER CENTER ONLY)
BASO#: 0 10*3/uL (ref 0.0–0.2)
BASO%: 0.5 % (ref 0.0–2.0)
EOS%: 3.7 % (ref 0.0–7.0)
Eosinophils Absolute: 0.1 10*3/uL (ref 0.0–0.5)
HCT: 36.3 % (ref 34.8–46.6)
HGB: 12.2 g/dL (ref 11.6–15.9)
LYMPH#: 1.6 10*3/uL (ref 0.9–3.3)
LYMPH%: 43.2 % (ref 14.0–48.0)
MCH: 30.6 pg (ref 26.0–34.0)
MCHC: 33.6 g/dL (ref 32.0–36.0)
MCV: 91 fL (ref 81–101)
MONO#: 0.3 10*3/uL (ref 0.1–0.9)
MONO%: 6.7 % (ref 0.0–13.0)
NEUT#: 1.7 10*3/uL (ref 1.5–6.5)
NEUT%: 45.9 % (ref 39.6–80.0)
Platelets: 287 10*3/uL (ref 145–400)
RBC: 3.99 10*6/uL (ref 3.70–5.32)
RDW: 11.7 % (ref 11.1–15.7)
WBC: 3.8 10*3/uL — ABNORMAL LOW (ref 3.9–10.0)

## 2016-04-17 LAB — IRON AND TIBC
%SAT: 42 % (ref 21–57)
Iron: 103 ug/dL (ref 41–142)
TIBC: 243 ug/dL (ref 236–444)
UIBC: 140 ug/dL (ref 120–384)

## 2016-04-17 LAB — FERRITIN: Ferritin: 86 ng/ml (ref 9–269)

## 2016-04-17 LAB — RETICULOCYTES: Reticulocyte Count: 0.9 % (ref 0.6–2.6)

## 2016-04-17 NOTE — Progress Notes (Signed)
Hematology and Oncology Follow Up Visit  [name] [name] [id number] March 25, 1971 45 y.o. 04/17/2016   Principle Diagnosis:  Bone marrow fibrosis Intermittent iron deficiency anemia Juvenile rheumatoid arthritis  Current Therapy:   IV iron as indicated    Interim History: [name] [name] is here today for follow-up. She is doing well but has been having constipation off and on. She denies any medication changes but admits to not eating healthy. She will try a high fiber diet and Mirilax daily. She also uses a cleansing tea to help and takes a probiotic daily. She is staying well hydrated.  No fever, chills, n/v, cough, rash, dizziness, headache, chest pain, palpitations, abdominal pain, and changes in bowel or bladder habits. She has some SOB at times with exertion that resolves with rest. She attributes this to her weight. She has recently started going to the gym twice a week and is trying to lose weight.  She has chronic swelling in her joints and numbness and tingling in her hands and feet due to rheumatoid arthritis. She is currently on weekly Remicade injections and is followed closely by rheumatology.   Medications:    Medication List       Accurate as of 04/17/16  9:42 AM. Always use your most recent med list.          Acetaminophen-Guaifenesin 650-400 MG Tabs Take by mouth.   amoxicillin-clavulanate 875-125 MG tablet Commonly known as:  AUGMENTIN Take 1 tablet by mouth 2 (two) times daily.   folic acid 1 MG tablet Commonly known as:  FOLVITE Take 1 mg by mouth daily.   HYDROcodone-acetaminophen 5-325 MG tablet Commonly known as:  NORCO/VICODIN Take 1-2 tablets by mouth every 4 (four) hours as needed for moderate pain.   hydroxychloroquine 200 MG tablet Commonly known as:  PLAQUENIL Take 400 mg by mouth daily.   levonorgestrel-ethinyl estradiol 0.1-20 MG-MCG tablet Commonly known as:  AVIANE Take 1 tablet by mouth daily.   Methotrexate (PF) 25 MG/0.4ML  Soaj Inject 1 Dose into the skin. Patients does her shot eveyr sunday   multivitamin-iron-minerals-folic acid Tabs tablet Take 1 tablet by mouth daily.   oxyCODONE 5 MG immediate release tablet Commonly known as:  Oxy IR/ROXICODONE Take by mouth.   predniSONE 5 MG tablet Commonly known as:  DELTASONE Take 3 mg by mouth daily with breakfast.       Allergies:  Allergies  Allergen Reactions  . Coconut Fatty Acids     Coconut in raw form  . Aspirin     Past Medical History, Surgical history, Social history, and Family History were reviewed and updated.  Review of Systems: All other 10 point review of systems is negative.   Physical Exam:  height is 5\' 6"  (1.676 m) and weight is 207 lb (93.9 kg). Her oral temperature is 98.5 F (36.9 C). Her blood pressure is 112/68 and her pulse is 61. Her respiration is 18.   Wt Readings from Last 3 Encounters:  04/17/16 207 lb (93.9 kg)  10/18/15 192 lb (87.1 kg)  05/21/15 190 lb 1.6 oz (86.2 kg)    Ocular: Sclerae unicteric, pupils equal, round and reactive to light Ear-nose-throat: Oropharynx clear, dentition fair Lymphatic: No cervical supraclavicular or axillary adenopathy Lungs no rales or rhonchi, good excursion bilaterally Heart regular rate and rhythm, no murmur appreciated Abd soft, nontender, positive bowel sounds, no liver or spleen tip palpated on exam MSK no focal spinal tenderness, no joint edema Neuro: non-focal, well-oriented, appropriate affect Breasts: Deferred  Lab Results  Component Value Date   WBC 3.8 (L) 04/17/2016   HGB 12.2 04/17/2016   HCT 36.3 04/17/2016   MCV 91 04/17/2016   PLT 287 04/17/2016   Lab Results  Component Value Date   FERRITIN 76 10/18/2015   IRON 71 10/18/2015   TIBC 249 10/18/2015   UIBC 177 10/18/2015   IRONPCTSAT 29 10/18/2015   Lab Results  Component Value Date   RETICCTPCT 0.9 04/15/2015   RBC 3.99 04/17/2016   RETICCTABS 34.9 04/15/2015   No results found for:  KPAFRELGTCHN, LAMBDASER, KAPLAMBRATIO No results found for: IGGSERUM, IGA, IGMSERUM No results found for: Odetta Pink, SPEI   Chemistry      Component Value Date/Time   NA 140 10/18/2015 0940   NA 140 10/15/2014 0906   K 3.7 10/18/2015 0940   K 3.9 10/15/2014 0906   CL 105 10/18/2015 0940   CO2 26 10/18/2015 0940   CO2 25 10/15/2014 0906   BUN 11 10/18/2015 0940   BUN 11.0 10/15/2014 0906   CREATININE 0.8 10/18/2015 0940   CREATININE 0.8 10/15/2014 0906      Component Value Date/Time   CALCIUM 9.1 10/18/2015 0940   CALCIUM 8.8 10/15/2014 0906   ALKPHOS 46 10/18/2015 0940   ALKPHOS 46 10/15/2014 0906   AST 23 10/18/2015 0940   AST 19 10/15/2014 0906   ALT 18 10/18/2015 0940   ALT 19 10/15/2014 0906   BILITOT 0.60 10/18/2015 0940   BILITOT 0.59 10/15/2014 0906     Impression and Plan:[name] [name] is 45 year old African-American female with intermittent iron deficiency anemia secondary to bone marrow fibrosis. She is doing well and has not required an infusion in over 2 years. Her iron saturation in February was 29% with a ferritin of 76.  We will see what her iron studies today show and bring her back in later this week for an infusion if needed.  We will plan to see her back in 6 months for labs and follow-up. She will contact us with any questions or concerns. We can certainly see her sooner if need be.  Eliezer Bottom, NP 8/7/20179:42 AM

## 2016-05-03 DIAGNOSIS — M2550 Pain in unspecified joint: Secondary | ICD-10-CM | POA: Diagnosis not present

## 2016-05-03 DIAGNOSIS — R0602 Shortness of breath: Secondary | ICD-10-CM | POA: Diagnosis not present

## 2016-05-03 DIAGNOSIS — Z79899 Other long term (current) drug therapy: Secondary | ICD-10-CM | POA: Diagnosis not present

## 2016-05-03 DIAGNOSIS — R768 Other specified abnormal immunological findings in serum: Secondary | ICD-10-CM | POA: Diagnosis not present

## 2016-05-03 DIAGNOSIS — M1990 Unspecified osteoarthritis, unspecified site: Secondary | ICD-10-CM | POA: Diagnosis not present

## 2016-05-03 DIAGNOSIS — D7581 Myelofibrosis: Secondary | ICD-10-CM | POA: Diagnosis not present

## 2016-06-12 DIAGNOSIS — M359 Systemic involvement of connective tissue, unspecified: Secondary | ICD-10-CM | POA: Diagnosis not present

## 2016-06-12 DIAGNOSIS — Z5181 Encounter for therapeutic drug level monitoring: Secondary | ICD-10-CM | POA: Diagnosis not present

## 2016-07-17 DIAGNOSIS — D7581 Myelofibrosis: Secondary | ICD-10-CM | POA: Diagnosis not present

## 2016-07-17 DIAGNOSIS — D899 Disorder involving the immune mechanism, unspecified: Secondary | ICD-10-CM | POA: Diagnosis not present

## 2016-09-12 DIAGNOSIS — M359 Systemic involvement of connective tissue, unspecified: Secondary | ICD-10-CM | POA: Diagnosis not present

## 2016-09-12 DIAGNOSIS — Z5181 Encounter for therapeutic drug level monitoring: Secondary | ICD-10-CM | POA: Diagnosis not present

## 2016-10-05 DIAGNOSIS — R52 Pain, unspecified: Secondary | ICD-10-CM | POA: Diagnosis not present

## 2016-10-05 DIAGNOSIS — D849 Immunodeficiency, unspecified: Secondary | ICD-10-CM | POA: Diagnosis not present

## 2016-10-05 DIAGNOSIS — J069 Acute upper respiratory infection, unspecified: Secondary | ICD-10-CM | POA: Diagnosis not present

## 2016-10-16 ENCOUNTER — Ambulatory Visit (HOSPITAL_BASED_OUTPATIENT_CLINIC_OR_DEPARTMENT_OTHER): Payer: Medicare Other | Admitting: Family

## 2016-10-16 ENCOUNTER — Encounter: Payer: Self-pay | Admitting: *Deleted

## 2016-10-16 ENCOUNTER — Other Ambulatory Visit (HOSPITAL_BASED_OUTPATIENT_CLINIC_OR_DEPARTMENT_OTHER): Payer: Medicare Other

## 2016-10-16 VITALS — BP 124/73 | HR 75 | Temp 98.4°F | Wt 213.4 lb

## 2016-10-16 DIAGNOSIS — D509 Iron deficiency anemia, unspecified: Secondary | ICD-10-CM

## 2016-10-16 DIAGNOSIS — D508 Other iron deficiency anemias: Secondary | ICD-10-CM

## 2016-10-16 DIAGNOSIS — R5383 Other fatigue: Secondary | ICD-10-CM | POA: Diagnosis not present

## 2016-10-16 DIAGNOSIS — M069 Rheumatoid arthritis, unspecified: Secondary | ICD-10-CM

## 2016-10-16 DIAGNOSIS — D7581 Myelofibrosis: Secondary | ICD-10-CM

## 2016-10-16 LAB — CBC WITH DIFFERENTIAL (CANCER CENTER ONLY)
BASO#: 0 10*3/uL (ref 0.0–0.2)
BASO%: 0.8 % (ref 0.0–2.0)
EOS%: 4.2 % (ref 0.0–7.0)
Eosinophils Absolute: 0.2 10*3/uL (ref 0.0–0.5)
HCT: 37.3 % (ref 34.8–46.6)
HGB: 12.5 g/dL (ref 11.6–15.9)
LYMPH#: 1.5 10*3/uL (ref 0.9–3.3)
LYMPH%: 39.3 % (ref 14.0–48.0)
MCH: 30 pg (ref 26.0–34.0)
MCHC: 33.5 g/dL (ref 32.0–36.0)
MCV: 89 fL (ref 81–101)
MONO#: 0.4 10*3/uL (ref 0.1–0.9)
MONO%: 10.2 % (ref 0.0–13.0)
NEUT#: 1.7 10*3/uL (ref 1.5–6.5)
NEUT%: 45.5 % (ref 39.6–80.0)
Platelets: 309 10*3/uL (ref 145–400)
RBC: 4.17 10*6/uL (ref 3.70–5.32)
RDW: 11.5 % (ref 11.1–15.7)
WBC: 3.8 10*3/uL — ABNORMAL LOW (ref 3.9–10.0)

## 2016-10-16 LAB — IRON AND TIBC
%SAT: 27 % (ref 21–57)
Iron: 69 ug/dL (ref 41–142)
TIBC: 261 ug/dL (ref 236–444)
UIBC: 192 ug/dL (ref 120–384)

## 2016-10-16 LAB — COMPREHENSIVE METABOLIC PANEL
ALT: 14 U/L (ref 0–55)
AST: 12 U/L (ref 5–34)
Albumin: 3.9 g/dL (ref 3.5–5.0)
Alkaline Phosphatase: 48 U/L (ref 40–150)
Anion Gap: 7 mEq/L (ref 3–11)
BUN: 13.6 mg/dL (ref 7.0–26.0)
CO2: 23 mEq/L (ref 22–29)
Calcium: 9.3 mg/dL (ref 8.4–10.4)
Chloride: 107 mEq/L (ref 98–109)
Creatinine: 0.8 mg/dL (ref 0.6–1.1)
EGFR: >90 mL/min/{1.73_m2} (ref >90)
Glucose: 90 mg/dl (ref 70–140)
Potassium: 4 mEq/L (ref 3.5–5.1)
Sodium: 137 mEq/L (ref 136–145)
Total Bilirubin: 0.43 mg/dL (ref 0.20–1.20)
Total Protein: 7.1 g/dL (ref 6.4–8.3)

## 2016-10-16 LAB — FERRITIN: Ferritin: 98 ng/ml (ref 9–269)

## 2016-10-16 NOTE — Progress Notes (Signed)
Hematology and Oncology Follow Up Visit  [name] [name] [id number] 1971-05-04 46 y.o. 10/16/2016   Principle Diagnosis:  Bone marrow fibrosis Intermittent iron deficiency anemia Juvenile rheumatoid arthritis  Current Therapy:   IV iron as indicated    Interim History: [name] [name] is here today for follow-up. She is doing fairly well. She has had several recent RA flares due to the weather. She was able to treat these effectively with steroids and pain medication. She is still followed by rheumatology at [facility] and sees them several times a year.   She has had mild persistent fatigue lately and states that she "feels drained".  She denies having any episodes of bleeding or bruising.  She states that she has not had a cycle since her uterine ablation in December 2016.  No fever, chills, n/v, cough, rash, dizziness, headache, chest pain, palpitations, abdominal pain, and changes in bowel or bladder habits. She still has mild SOB with over exertion and will take time to rest as needed until this resolves.  The numbness and tingling in her hands and feet due to rheumatoid arthritis is unchanged. She has maintained a good appetite and is staying well hydrated. Her weight is stable.  She is watching her portions and trying to walk stairs for exercise. She would like to lose a bit more weight.   Medications:  Allergies as of 10/16/2016      Reactions   Coconut Fatty Acids    Coconut in raw form   Aspirin       Medication List       Accurate as of 10/16/16  8:44 AM. Always use your most recent med list.          Acetaminophen-Guaifenesin 650-400 MG Tabs Take by mouth.   amoxicillin-clavulanate 875-125 MG tablet Commonly known as:  AUGMENTIN Take 1 tablet by mouth 2 (two) times daily.   folic acid 1 MG tablet Commonly known as:  FOLVITE Take 1 mg by mouth daily.   HYDROcodone-acetaminophen 5-325 MG tablet Commonly known as:  NORCO/VICODIN Take 1-2 tablets by mouth every 4  (four) hours as needed for moderate pain.   hydroxychloroquine 200 MG tablet Commonly known as:  PLAQUENIL Take 400 mg by mouth daily.   levonorgestrel-ethinyl estradiol 0.1-20 MG-MCG tablet Commonly known as:  AVIANE Take 1 tablet by mouth daily.   Methotrexate (PF) 25 MG/0.4ML Soaj Inject 1 Dose into the skin. Patients does her shot eveyr sunday   multivitamin-iron-minerals-folic acid Tabs tablet Take 1 tablet by mouth daily.   oxyCODONE 5 MG immediate release tablet Commonly known as:  Oxy IR/ROXICODONE Take by mouth.   predniSONE 5 MG tablet Commonly known as:  DELTASONE Take 3 mg by mouth daily with breakfast.       Allergies:  Allergies  Allergen Reactions  . Coconut Fatty Acids     Coconut in raw form  . Aspirin     Past Medical History, Surgical history, Social history, and Family History were reviewed and updated.  Review of Systems: All other 10 point review of systems is negative.   Physical Exam:  vitals were not taken for this visit.  Wt Readings from Last 3 Encounters:  04/17/16 207 lb (93.9 kg)  10/18/15 192 lb (87.1 kg)  05/21/15 190 lb 1.6 oz (86.2 kg)    Ocular: Sclerae unicteric, pupils equal, round and reactive to light Ear-nose-throat: Oropharynx clear, dentition fair Lymphatic: No cervical supraclavicular or axillary adenopathy Lungs no rales or rhonchi, good excursion bilaterally Heart  regular rate and rhythm, no murmur appreciated Abd soft, nontender, positive bowel sounds, no liver or spleen tip palpated on exam MSK no focal spinal tenderness, no joint edema Neuro: non-focal, well-oriented, appropriate affect Breasts: Deferred  Lab Results  Component Value Date   WBC 3.8 (L) 04/17/2016   HGB 12.2 04/17/2016   HCT 36.3 04/17/2016   MCV 91 04/17/2016   PLT 287 04/17/2016   Lab Results  Component Value Date   FERRITIN 86 04/17/2016   IRON 103 04/17/2016   TIBC 243 04/17/2016   UIBC 140 04/17/2016   IRONPCTSAT 42 04/17/2016    Lab Results  Component Value Date   RETICCTPCT 0.9 04/15/2015   RBC 3.99 04/17/2016   RETICCTABS 34.9 04/15/2015   No results found for: KPAFRELGTCHN, LAMBDASER, KAPLAMBRATIO No results found for: IGGSERUM, IGA, IGMSERUM No results found for: Odetta Pink, SPEI   Chemistry      Component Value Date/Time   NA 139 04/17/2016 0916   K 4.4 04/17/2016 0916   CL 105 10/18/2015 0940   CO2 22 04/17/2016 0916   BUN 10.4 04/17/2016 0916   CREATININE 0.8 04/17/2016 0916      Component Value Date/Time   CALCIUM 9.2 04/17/2016 0916   ALKPHOS 44 04/17/2016 0916   AST 14 04/17/2016 0916   ALT 14 04/17/2016 0916   BILITOT 0.77 04/17/2016 0916     Impression and Plan:[name] [name] is 46 yo old [place] American female with intermittent iron deficiency anemia secondary to bone marrow fibrosis. She has had several RA flares lately and has had mild persistent fatigue. Hgb is stable at 12.5 with an MCV of 89.  We will see what her iron studies today show and bring her back in later this week for an infusion if needed.  We will go ahead and plan to see her back in 6 months for lab work and follow-up. She will contact our office with any questions or concerns. We can certainly see her sooner if need be.  Eliezer Bottom, NP 2/5/20188:44 AM

## 2016-10-17 LAB — RETICULOCYTES: Reticulocyte Count: 1.2 % (ref 0.6–2.6)

## 2016-11-12 DIAGNOSIS — Z3202 Encounter for pregnancy test, result negative: Secondary | ICD-10-CM | POA: Diagnosis not present

## 2016-12-11 DIAGNOSIS — D899 Disorder involving the immune mechanism, unspecified: Secondary | ICD-10-CM | POA: Diagnosis not present

## 2016-12-11 DIAGNOSIS — R768 Other specified abnormal immunological findings in serum: Secondary | ICD-10-CM | POA: Diagnosis not present

## 2016-12-11 DIAGNOSIS — M0600 Rheumatoid arthritis without rheumatoid factor, unspecified site: Secondary | ICD-10-CM | POA: Diagnosis not present

## 2016-12-11 DIAGNOSIS — Z5181 Encounter for therapeutic drug level monitoring: Secondary | ICD-10-CM | POA: Diagnosis not present

## 2016-12-11 DIAGNOSIS — D8989 Other specified disorders involving the immune mechanism, not elsewhere classified: Secondary | ICD-10-CM | POA: Diagnosis not present

## 2016-12-11 DIAGNOSIS — M1990 Unspecified osteoarthritis, unspecified site: Secondary | ICD-10-CM | POA: Diagnosis not present

## 2016-12-11 DIAGNOSIS — M083 Juvenile rheumatoid polyarthritis (seronegative): Secondary | ICD-10-CM | POA: Diagnosis not present

## 2016-12-11 DIAGNOSIS — M359 Systemic involvement of connective tissue, unspecified: Secondary | ICD-10-CM | POA: Diagnosis not present

## 2017-03-12 DIAGNOSIS — D8989 Other specified disorders involving the immune mechanism, not elsewhere classified: Secondary | ICD-10-CM | POA: Diagnosis not present

## 2017-03-12 DIAGNOSIS — M083 Juvenile rheumatoid polyarthritis (seronegative): Secondary | ICD-10-CM | POA: Diagnosis not present

## 2017-03-12 DIAGNOSIS — Z79899 Other long term (current) drug therapy: Secondary | ICD-10-CM | POA: Diagnosis not present

## 2017-03-12 DIAGNOSIS — R0609 Other forms of dyspnea: Secondary | ICD-10-CM | POA: Diagnosis not present

## 2017-04-16 ENCOUNTER — Ambulatory Visit (HOSPITAL_BASED_OUTPATIENT_CLINIC_OR_DEPARTMENT_OTHER): Payer: Medicare Other | Admitting: Hematology & Oncology

## 2017-04-16 ENCOUNTER — Other Ambulatory Visit (HOSPITAL_BASED_OUTPATIENT_CLINIC_OR_DEPARTMENT_OTHER): Payer: Medicare Other

## 2017-04-16 VITALS — BP 115/66 | HR 80 | Temp 98.3°F | Resp 16 | Wt 214.0 lb

## 2017-04-16 DIAGNOSIS — D500 Iron deficiency anemia secondary to blood loss (chronic): Secondary | ICD-10-CM | POA: Diagnosis not present

## 2017-04-16 DIAGNOSIS — D508 Other iron deficiency anemias: Secondary | ICD-10-CM | POA: Diagnosis not present

## 2017-04-16 DIAGNOSIS — M069 Rheumatoid arthritis, unspecified: Secondary | ICD-10-CM

## 2017-04-16 LAB — COMPREHENSIVE METABOLIC PANEL
ALT: 15 U/L (ref 0–55)
AST: 18 U/L (ref 5–34)
Albumin: 3.7 g/dL (ref 3.5–5.0)
Alkaline Phosphatase: 46 U/L (ref 40–150)
Anion Gap: 8 mEq/L (ref 3–11)
BUN: 11.5 mg/dL (ref 7.0–26.0)
CO2: 24 mEq/L (ref 22–29)
Calcium: 9.4 mg/dL (ref 8.4–10.4)
Chloride: 107 mEq/L (ref 98–109)
Creatinine: 0.8 mg/dL (ref 0.6–1.1)
EGFR: >90 mL/min/{1.73_m2} (ref >90)
Glucose: 85 mg/dl (ref 70–140)
Potassium: 4.3 mEq/L (ref 3.5–5.1)
Sodium: 139 mEq/L (ref 136–145)
Total Bilirubin: 0.45 mg/dL (ref 0.20–1.20)
Total Protein: 6.9 g/dL (ref 6.4–8.3)

## 2017-04-16 LAB — CBC WITH DIFFERENTIAL (CANCER CENTER ONLY)
BASO#: 0 10*3/uL (ref 0.0–0.2)
BASO%: 0.5 % (ref 0.0–2.0)
EOS%: 3.4 % (ref 0.0–7.0)
Eosinophils Absolute: 0.1 10*3/uL (ref 0.0–0.5)
HCT: 35.9 % (ref 34.8–46.6)
HGB: 12.2 g/dL (ref 11.6–15.9)
LYMPH#: 1.7 10*3/uL (ref 0.9–3.3)
LYMPH%: 41 % (ref 14.0–48.0)
MCH: 30.9 pg (ref 26.0–34.0)
MCHC: 34 g/dL (ref 32.0–36.0)
MCV: 91 fL (ref 81–101)
MONO#: 0.3 10*3/uL (ref 0.1–0.9)
MONO%: 7.8 % (ref 0.0–13.0)
NEUT#: 2 10*3/uL (ref 1.5–6.5)
NEUT%: 47.3 % (ref 39.6–80.0)
Platelets: 296 10*3/uL (ref 145–400)
RBC: 3.95 10*6/uL (ref 3.70–5.32)
RDW: 11.8 % (ref 11.1–15.7)
WBC: 4.1 10*3/uL (ref 3.9–10.0)

## 2017-04-16 LAB — IRON AND TIBC
%SAT: 33 % (ref 21–57)
Iron: 77 ug/dL (ref 41–142)
TIBC: 238 ug/dL (ref 236–444)
UIBC: 160 ug/dL (ref 120–384)

## 2017-04-16 LAB — FERRITIN: Ferritin: 105 ng/ml (ref 9–269)

## 2017-04-16 NOTE — Progress Notes (Signed)
Hematology and Oncology Follow Up Visit  MAGUIRE KILLMER [id number] [birth year] 46 y.o. 04/16/2017   Principle Diagnosis:  Bone marrow fibrosis Intermittent iron deficiency anemia Juvenile rheumatoid arthritis  Current Therapy:   IV iron as indicated    Interim History: [name] [name] is here today for follow-up. She has had a nice summer she has to go back last night from going fishing on the [facility]. She apparently takes a group of children. This is so impressive. Unfortunately, she did not catch anything. However, she did not get seasick.  She has had no issues with her juvenile RA. She does see a rheumatologist at [facility]. She has been taking injections for this.  We last saw her 6 months ago, her iron studies showed a ferritin of 98 with an iron saturation of 27%.  She has had some loose stool. She's been taking some Imodium. I told her to try some Pepto-Bismol.  She's had no fever. She's had no cough. She's had no rashes.  Overall, her performance status is ECOG 1.    Medications:  Allergies as of 04/16/2017      Reactions   Coconut Fatty Acids    Coconut in raw form   Aspirin       Medication List       Accurate as of 04/16/17  8:29 AM. Always use your most recent med list.          Acetaminophen-Guaifenesin 650-400 MG Tabs Take by mouth.   folic acid 1 MG tablet Commonly known as:  FOLVITE Take 1 mg by mouth daily.   HYDROcodone-acetaminophen 5-325 MG tablet Commonly known as:  NORCO/VICODIN Take 1-2 tablets by mouth every 4 (four) hours as needed for moderate pain.   hydroxychloroquine 200 MG tablet Commonly known as:  PLAQUENIL Take 400 mg by mouth daily.   levonorgestrel-ethinyl estradiol 0.1-20 MG-MCG tablet Commonly known as:  AVIANE Take 1 tablet by mouth daily.   Methotrexate (PF) 25 MG/0.4ML Soaj Inject 1 Dose into the skin. Patients does her shot eveyr sunday   multivitamin-iron-minerals-folic acid Tabs tablet Take 1 tablet by mouth  daily.   oxyCODONE 5 MG immediate release tablet Commonly known as:  Oxy IR/ROXICODONE Take by mouth every 6 (six) hours as needed.       Allergies:  Allergies  Allergen Reactions  . Coconut Fatty Acids     Coconut in raw form  . Aspirin     Past Medical History, Surgical history, Social history, and Family History were reviewed and updated.  Review of Systems: All other 10 point review of systems is negative.   Physical Exam:  weight is 214 lb (97.1 kg). Her oral temperature is 98.3 F (36.8 C). Her blood pressure is 115/66 and her pulse is 80. Her respiration is 16 and oxygen saturation is 100%.   Wt Readings from Last 3 Encounters:  04/16/17 214 lb (97.1 kg)  10/16/16 213 lb 6.4 oz (96.8 kg)  04/17/16 207 lb (93.9 kg)    Well-developed and well-nourished African-American female. She is moderately obese. Head and neck exam shows no ocular or oral lesions. She has no adenopathy in the neck. Lungs are clear bilaterally. Cardiac exam regular rate and rhythm with no murmurs, rubs or bruits. Abdomen is soft. She has good bowel sounds. She is moderately obese. She has no fluid wave. There is no guarding or rebound tenderness. There is no palpable liver or spleen tip. Back exam shows no tenderness over the spine, ribs or hips.  Extremities shows no clubbing, cyanosis or edema. There may be some slight swelling in her hands. She has good range of motion of her joints. Skin exam shows no rashes, ecchymoses or petechia. Neurological exam shows no focal neurological deficits.  Lab Results  Component Value Date   WBC 4.1 04/16/2017   HGB 12.2 04/16/2017   HCT 35.9 04/16/2017   MCV 91 04/16/2017   PLT 296 04/16/2017   Lab Results  Component Value Date   FERRITIN 98 10/16/2016   IRON 69 10/16/2016   TIBC 261 10/16/2016   UIBC 192 10/16/2016   IRONPCTSAT 27 10/16/2016   Lab Results  Component Value Date   RETICCTPCT 0.9 04/15/2015   RBC 3.95 04/16/2017   RETICCTABS 34.9  04/15/2015   No results found for: KPAFRELGTCHN, LAMBDASER, KAPLAMBRATIO No results found for: IGGSERUM, IGA, IGMSERUM No results found for: Odetta Pink, SPEI   Chemistry      Component Value Date/Time   NA 137 10/16/2016 0837   K 4.0 10/16/2016 0837   CL 105 10/18/2015 0940   CO2 23 10/16/2016 0837   BUN 13.6 10/16/2016 0837   CREATININE 0.8 10/16/2016 0837      Component Value Date/Time   CALCIUM 9.3 10/16/2016 0837   ALKPHOS 48 10/16/2016 0837   AST 12 10/16/2016 0837   ALT 14 10/16/2016 0837   BILITOT 0.43 10/16/2016 0837     Impression and Plan:Ms. Flax is 46 yo old [place] American female with intermittent iron deficiency anemia secondary to bone marrow fibrosis.  It will be interesting to see what her iron studies show.  IV we get her back in 6 more months. I really don't see any issues that we have to deal with, other than the iron deficiency.  [name], MD 8/6/20188:29 AM

## 2017-04-17 LAB — RETICULOCYTES: Reticulocyte Count: 1 % (ref 0.6–2.6)

## 2017-05-11 DIAGNOSIS — R109 Unspecified abdominal pain: Secondary | ICD-10-CM | POA: Diagnosis not present

## 2017-05-11 DIAGNOSIS — K5900 Constipation, unspecified: Secondary | ICD-10-CM | POA: Diagnosis not present

## 2017-05-11 DIAGNOSIS — R1084 Generalized abdominal pain: Secondary | ICD-10-CM | POA: Diagnosis not present

## 2017-05-11 DIAGNOSIS — R110 Nausea: Secondary | ICD-10-CM | POA: Diagnosis not present

## 2017-07-16 DIAGNOSIS — D7581 Myelofibrosis: Secondary | ICD-10-CM | POA: Diagnosis not present

## 2017-07-16 DIAGNOSIS — D899 Disorder involving the immune mechanism, unspecified: Secondary | ICD-10-CM | POA: Diagnosis not present

## 2017-10-12 ENCOUNTER — Telehealth: Payer: Self-pay | Admitting: *Deleted

## 2017-10-12 DIAGNOSIS — J029 Acute pharyngitis, unspecified: Secondary | ICD-10-CM | POA: Diagnosis not present

## 2017-10-12 DIAGNOSIS — R05 Cough: Secondary | ICD-10-CM | POA: Diagnosis not present

## 2017-10-12 DIAGNOSIS — M791 Myalgia, unspecified site: Secondary | ICD-10-CM | POA: Diagnosis not present

## 2017-10-12 DIAGNOSIS — H66003 Acute suppurative otitis media without spontaneous rupture of ear drum, bilateral: Secondary | ICD-10-CM | POA: Diagnosis not present

## 2017-10-12 DIAGNOSIS — J111 Influenza due to unidentified influenza virus with other respiratory manifestations: Secondary | ICD-10-CM | POA: Diagnosis not present

## 2017-10-12 NOTE — Telephone Encounter (Signed)
Patient is c/o flu like symptoms with an extremely sore throat. She would like to be worked up for the flu and/or strep.   Explained to the patient that this office does not perform work up for flu or strep and that she needed to call her PCP. She stated they were closed on Friday. Directed her to seek assessment at an urgent care.   She understood.

## 2017-10-17 ENCOUNTER — Other Ambulatory Visit: Payer: Self-pay

## 2017-10-17 ENCOUNTER — Inpatient Hospital Stay (HOSPITAL_BASED_OUTPATIENT_CLINIC_OR_DEPARTMENT_OTHER): Payer: Medicare Other | Admitting: Hematology & Oncology

## 2017-10-17 ENCOUNTER — Inpatient Hospital Stay: Payer: Medicare Other | Attending: Hematology & Oncology

## 2017-10-17 ENCOUNTER — Encounter: Payer: Self-pay | Admitting: Hematology & Oncology

## 2017-10-17 ENCOUNTER — Telehealth: Payer: Self-pay | Admitting: *Deleted

## 2017-10-17 ENCOUNTER — Inpatient Hospital Stay: Payer: Medicare Other

## 2017-10-17 VITALS — BP 123/81 | HR 76 | Temp 98.4°F | Resp 18 | Wt 213.0 lb

## 2017-10-17 DIAGNOSIS — M0800 Unspecified juvenile rheumatoid arthritis of unspecified site: Secondary | ICD-10-CM

## 2017-10-17 DIAGNOSIS — D500 Iron deficiency anemia secondary to blood loss (chronic): Secondary | ICD-10-CM | POA: Diagnosis not present

## 2017-10-17 DIAGNOSIS — Z79899 Other long term (current) drug therapy: Secondary | ICD-10-CM | POA: Diagnosis not present

## 2017-10-17 DIAGNOSIS — J329 Chronic sinusitis, unspecified: Secondary | ICD-10-CM | POA: Diagnosis not present

## 2017-10-17 DIAGNOSIS — D509 Iron deficiency anemia, unspecified: Secondary | ICD-10-CM | POA: Diagnosis not present

## 2017-10-17 DIAGNOSIS — J156 Pneumonia due to other aerobic Gram-negative bacteria: Secondary | ICD-10-CM

## 2017-10-17 LAB — IRON AND TIBC
Iron: 75 ug/dL (ref 41–142)
Saturation Ratios: 30 % (ref 21–57)
TIBC: 250 ug/dL (ref 236–444)
UIBC: 176 ug/dL

## 2017-10-17 LAB — CBC WITH DIFFERENTIAL (CANCER CENTER ONLY)
Basophils Absolute: 0 10*3/uL (ref 0.0–0.1)
Basophils Relative: 1 %
Eosinophils Absolute: 0.2 10*3/uL (ref 0.0–0.5)
Eosinophils Relative: 4 %
HCT: 39.2 % (ref 34.8–46.6)
Hemoglobin: 13.3 g/dL (ref 11.6–15.9)
Lymphocytes Relative: 47 %
Lymphs Abs: 2.1 10*3/uL (ref 0.9–3.3)
MCH: 30.4 pg (ref 26.0–34.0)
MCHC: 33.9 g/dL (ref 32.0–36.0)
MCV: 89.5 fL (ref 81.0–101.0)
Monocytes Absolute: 0.4 10*3/uL (ref 0.1–0.9)
Monocytes Relative: 8 %
Neutro Abs: 1.7 10*3/uL (ref 1.5–6.5)
Neutrophils Relative %: 40 %
Platelet Count: 327 10*3/uL (ref 145–400)
RBC: 4.38 MIL/uL (ref 3.70–5.32)
RDW: 11.7 % (ref 11.1–15.7)
WBC Count: 4.4 10*3/uL (ref 3.9–10.0)

## 2017-10-17 LAB — CMP (CANCER CENTER ONLY)
ALT: 13 U/L (ref 0–55)
AST: 16 U/L (ref 5–34)
Albumin: 4 g/dL (ref 3.5–5.0)
Alkaline Phosphatase: 47 U/L (ref 40–150)
Anion gap: 10 (ref 3–11)
BUN: 14 mg/dL (ref 7–26)
CO2: 24 mmol/L (ref 22–29)
Calcium: 9.4 mg/dL (ref 8.4–10.4)
Chloride: 104 mmol/L (ref 98–109)
Creatinine: 0.86 mg/dL (ref 0.60–1.10)
GFR, Est AFR Am: >60 mL/min (ref >60)
GFR, Estimated: >60 mL/min (ref >60)
Glucose, Bld: 98 mg/dL (ref 70–140)
Potassium: 4 mmol/L (ref 3.5–5.1)
Sodium: 138 mmol/L (ref 136–145)
Total Bilirubin: 0.4 mg/dL (ref 0.2–1.2)
Total Protein: 7.4 g/dL (ref 6.4–8.3)

## 2017-10-17 LAB — RETICULOCYTES
RBC.: 4.37 MIL/uL (ref 3.70–5.45)
Retic Count, Absolute: 52.4 10*3/uL (ref 33.7–90.7)
Retic Ct Pct: 1.2 % (ref 0.7–2.1)

## 2017-10-17 LAB — FERRITIN: Ferritin: 86 ng/mL (ref 9–269)

## 2017-10-17 MED ORDER — DEXTROSE 5 % IV SOLN
2.0000 g | Freq: Once | INTRAVENOUS | Status: AC
  Administered 2017-10-17: 2 g via INTRAVENOUS
  Filled 2017-10-17: qty 2

## 2017-10-17 MED ORDER — IPRATROPIUM-ALBUTEROL 0.5-2.5 (3) MG/3ML IN SOLN
RESPIRATORY_TRACT | Status: AC
  Filled 2017-10-17: qty 3

## 2017-10-17 MED ORDER — IPRATROPIUM-ALBUTEROL 0.5-2.5 (3) MG/3ML IN SOLN
3.0000 mL | Freq: Once | RESPIRATORY_TRACT | Status: AC
  Administered 2017-10-17: 3 mL via RESPIRATORY_TRACT

## 2017-10-17 NOTE — Telephone Encounter (Addendum)
Patient is aware of results  ----- Message from [name], MD sent at 10/17/2017 11:46 AM EST ----- Call - iron levels are ok!!  Laurey Arrow

## 2017-10-17 NOTE — Progress Notes (Signed)
Hematology and Oncology Follow Up Visit  [name] [name] [id number] Oct 19, 1970 47 y.o. 10/17/2017   Principle Diagnosis:  Bone marrow fibrosis Intermittent iron deficiency anemia Juvenile rheumatoid arthritis  Current Therapy:   IV iron as indicated    Interim History: [name] [name] is here today for follow-up.she is not feeling all that well.  She, like a lot of our patients, has a sinus infection.  She has had this for a couple weeks.  She has been on different antibiotics.  So far, not much has helped.  I will give her a dose of IV Rocephin today.  She, being on methotrexate, probably is not helping.  She had a little bit of a flareup of the rheumatoid arthritis.  Thankfully, she was not hospitalized.  We did do iron studies on her today.  Her ferritin was 86 with an iron saturation of 30%.  She has had no fever.  She had no bleeding.  She is had no rashes.  She is had no leg swelling.  Overall, her performance status is ECOG 1.    Medications:  Allergies as of 10/17/2017      Reactions   Aspirin Itching, Shortness Of Breath   Coconut Oil Itching, Other (See Comments)   Sores in mouth Sores in mouth   Coconut Fatty Acids    Coconut in raw form      Medication List        Accurate as of 10/17/17  8:26 AM. Always use your most recent med list.          Acetaminophen-Guaifenesin 650-400 MG Tabs Take by mouth.   amoxicillin-clavulanate 875-125 MG tablet Commonly known as:  AUGMENTIN   folic acid 1 MG tablet Commonly known as:  FOLVITE Take 1 mg by mouth daily.   HYDROcodone-acetaminophen 5-325 MG tablet Commonly known as:  NORCO/VICODIN Take 1-2 tablets by mouth every 4 (four) hours as needed for moderate pain.   hydroxychloroquine 200 MG tablet Commonly known as:  PLAQUENIL Take 400 mg by mouth daily.   levonorgestrel-ethinyl estradiol 0.1-20 MG-MCG tablet Commonly known as:  AVIANE Take 1 tablet by mouth daily.   Methotrexate (PF) 25 MG/0.4ML  Soaj Inject 1 Dose into the skin. Patients does her shot eveyr sunday   multivitamin-iron-minerals-folic acid Tabs tablet Take 1 tablet by mouth daily.   oseltamivir 75 MG capsule Commonly known as:  TAMIFLU   oxyCODONE 5 MG immediate release tablet Commonly known as:  Oxy IR/ROXICODONE Take by mouth every 6 (six) hours as needed.   VENTOLIN HFA 108 (90 Base) MCG/ACT inhaler Generic drug:  albuterol       Allergies:  Allergies  Allergen Reactions  . Aspirin Itching and Shortness Of Breath  . Coconut Oil Itching and Other (See Comments)    Sores in mouth Sores in mouth   . Coconut Fatty Acids     Coconut in raw form    Past Medical History, Surgical history, Social history, and Family History were reviewed and updated.  Review of Systems: Review of Systems  Constitutional: Positive for malaise/fatigue.  HENT: Positive for congestion and sinus pain.   Eyes: Negative.   Respiratory: Positive for cough.   Cardiovascular: Negative.   Gastrointestinal: Negative.   Genitourinary: Negative.   Musculoskeletal: Positive for joint pain and myalgias.  Skin: Negative.   Neurological: Negative.   Endo/Heme/Allergies: Negative.   Psychiatric/Behavioral: Negative.      Physical Exam:  weight is 213 lb (96.6 kg). Her oral temperature is 98.4 F (  36.9 C). Her blood pressure is 123/81 and her pulse is 76. Her respiration is 18 and oxygen saturation is 98%.   Wt Readings from Last 3 Encounters:  10/17/17 213 lb (96.6 kg)  04/16/17 214 lb (97.1 kg)  10/16/16 213 lb 6.4 oz (96.8 kg)    Physical Exam  Constitutional: She is oriented to person, place, and time.  HENT:  Head: Normocephalic and atraumatic.  Mouth/Throat: Oropharynx is clear and moist.  Eyes: EOM are normal. Pupils are equal, round, and reactive to light.  Neck: Normal range of motion.  Cardiovascular: Normal rate, regular rhythm and normal heart sounds.  Pulmonary/Chest: Effort normal and breath sounds  normal.  Abdominal: Soft. Bowel sounds are normal.  Musculoskeletal: Normal range of motion. She exhibits no edema, tenderness or deformity.  Lymphadenopathy:    She has no cervical adenopathy.  Neurological: She is alert and oriented to person, place, and time.  Skin: Skin is warm and dry. No rash noted. No erythema.  Psychiatric: She has a normal mood and affect. Her behavior is normal. Judgment and thought content normal.  Vitals reviewed.  Lab Results  Component Value Date   WBC 4.4 10/17/2017   HGB 12.2 04/16/2017   HCT 39.2 10/17/2017   MCV 89.5 10/17/2017   PLT 327 10/17/2017   Lab Results  Component Value Date   FERRITIN 105 04/16/2017   IRON 77 04/16/2017   TIBC 238 04/16/2017   UIBC 160 04/16/2017   IRONPCTSAT 33 04/16/2017   Lab Results  Component Value Date   RETICCTPCT 0.9 04/15/2015   RBC 4.38 10/17/2017   RETICCTABS 34.9 04/15/2015   No results found for: KPAFRELGTCHN, LAMBDASER, KAPLAMBRATIO No results found for: IGGSERUM, IGA, IGMSERUM No results found for: Odetta Pink, SPEI   Chemistry      Component Value Date/Time   NA 139 04/16/2017 0810   K 4.3 04/16/2017 0810   CL 105 10/18/2015 0940   CO2 24 04/16/2017 0810   BUN 11.5 04/16/2017 0810   CREATININE 0.8 04/16/2017 0810      Component Value Date/Time   CALCIUM 9.4 04/16/2017 0810   ALKPHOS 46 04/16/2017 0810   AST 18 04/16/2017 0810   ALT 15 04/16/2017 0810   BILITOT 0.45 04/16/2017 0810     Impression and Plan:[name] [name] is 47 yo old [place] American female with intermittent iron deficiency anemia secondary to bone marrow fibrosis.  Thankfully, her iron studies are doing okay.  We will go ahead with the IV antibiotics.  I will not give her anything else new.  We will plan to have her come back to see [place] in another 6 months.  From a hematologic point of view, everything seems to be doing pretty well.  [name],  MD 2/6/20198:26 AM

## 2017-10-17 NOTE — Patient Instructions (Signed)
Ceftriaxone injection What is this medicine? CEFTRIAXONE (sef try AX one) is a cephalosporin antibiotic. It is used to treat certain kinds of bacterial infections. It will not work for colds, flu, or other viral infections. This medicine may be used for other purposes; ask your health care provider or pharmacist if you have questions. COMMON BRAND NAME(S): Rocephin What should I tell my health care provider before I take this medicine? They need to know if you have any of these conditions: -any chronic illness -bowel disease, like colitis -both kidney and liver disease -high bilirubin level in newborn patients -an unusual or allergic reaction to ceftriaxone, other cephalosporin or penicillin antibiotics, foods, dyes, or preservatives -pregnant or trying to get pregnant -breast-feeding How should I use this medicine? This medicine is injected into a muscle or infused it into a vein. It is usually given in a medical office or clinic. If you are to give this medicine you will be taught how to inject it. Follow instructions carefully. Use your doses at regular intervals. Do not take your medicine more often than directed. Do not skip doses or stop your medicine early even if you feel better. Do not stop taking except on your doctor's advice. Talk to your pediatrician regarding the use of this medicine in children. Special care may be needed. Overdosage: If you think you have taken too much of this medicine contact a poison control center or emergency room at once. NOTE: This medicine is only for you. Do not share this medicine with others. What if I miss a dose? If you miss a dose, take it as soon as you can. If it is almost time for your next dose, take only that dose. Do not take double or extra doses. What may interact with this medicine? Do not take this medicine with any of the following medications: -intravenous calcium This medicine may also interact with the following medications: -birth  control pills This list may not describe all possible interactions. Give your health care provider a list of all the medicines, herbs, non-prescription drugs, or dietary supplements you use. Also tell them if you smoke, drink alcohol, or use illegal drugs. Some items may interact with your medicine. What should I watch for while using this medicine? Tell your doctor or health care professional if your symptoms do not improve or if they get worse. Do not treat diarrhea with over the counter products. Contact your doctor if you have diarrhea that lasts more than 2 days or if it is severe and watery. If you are being treated for a sexually transmitted disease, avoid sexual contact until you have finished your treatment. Having sex can infect your sexual partner. Calcium may bind to this medicine and cause lung or kidney problems. Avoid calcium products while taking this medicine and for 48 hours after taking the last dose of this medicine. What side effects may I notice from receiving this medicine? Side effects that you should report to your doctor or health care professional as soon as possible: -allergic reactions like skin rash, itching or hives, swelling of the face, lips, or tongue -breathing problems -fever, chills -irregular heartbeat -pain when passing urine -seizures -stomach pain, cramps -unusual bleeding, bruising -unusually weak or tired Side effects that usually do not require medical attention (report to your doctor or health care professional if they continue or are bothersome): -diarrhea -dizzy, drowsy -headache -nausea, vomiting -pain, swelling, irritation where injected -stomach upset -sweating This list may not describe all possible side effects.   Call your doctor for medical advice about side effects. You may report side effects to FDA at 1-800-FDA-1088. Where should I keep my medicine? Keep out of the reach of children. Store at room temperature below 25 degrees C (77  degrees F). Protect from light. Throw away any unused vials after the expiration date. NOTE: This sheet is a summary. It may not cover all possible information. If you have questions about this medicine, talk to your doctor, pharmacist, or health care provider.  2018 Elsevier/Gold Standard (2014-03-16 09:14:54)  

## 2017-10-19 LAB — HEMOGLOBINOPATHY EVALUATION
Hgb A2 Quant: 2.4 % (ref 1.8–3.2)
Hgb A: 97.6 % (ref 96.4–98.8)
Hgb C: 0 %
Hgb F Quant: 0 % (ref 0.0–2.0)
Hgb S Quant: 0 %
Hgb Variant: 0 %

## 2017-11-22 DIAGNOSIS — H9209 Otalgia, unspecified ear: Secondary | ICD-10-CM | POA: Diagnosis not present

## 2017-12-16 DIAGNOSIS — M7582 Other shoulder lesions, left shoulder: Secondary | ICD-10-CM | POA: Diagnosis not present

## 2018-01-31 ENCOUNTER — Encounter: Payer: Self-pay | Admitting: Obstetrics & Gynecology

## 2018-01-31 ENCOUNTER — Other Ambulatory Visit (HOSPITAL_COMMUNITY)
Admission: RE | Admit: 2018-01-31 | Discharge: 2018-01-31 | Disposition: A | Discharge status: Home / Self Care | Payer: Medicare Other | Source: Ambulatory Visit | Attending: Obstetrics & Gynecology | Admitting: Obstetrics & Gynecology

## 2018-01-31 ENCOUNTER — Ambulatory Visit (INDEPENDENT_AMBULATORY_CARE_PROVIDER_SITE_OTHER): Payer: Medicare Other | Admitting: Obstetrics & Gynecology

## 2018-01-31 ENCOUNTER — Other Ambulatory Visit: Payer: Self-pay | Admitting: Obstetrics & Gynecology

## 2018-01-31 VITALS — BP 120/73 | HR 69 | Ht 66.0 in | Wt 218.8 lb

## 2018-01-31 DIAGNOSIS — Z124 Encounter for screening for malignant neoplasm of cervix: Secondary | ICD-10-CM

## 2018-01-31 DIAGNOSIS — Z01419 Encounter for gynecological examination (general) (routine) without abnormal findings: Secondary | ICD-10-CM

## 2018-01-31 DIAGNOSIS — Z1239 Encounter for other screening for malignant neoplasm of breast: Secondary | ICD-10-CM

## 2018-01-31 DIAGNOSIS — N8000 Endometriosis of the uterus, unspecified: Secondary | ICD-10-CM

## 2018-01-31 MED ORDER — DICLOFENAC POTASSIUM 50 MG PO TABS: 50.0000 mg | ORAL_TABLET | Freq: Three times a day (TID) | ORAL | 1 refills | Status: DC

## 2018-01-31 MED ORDER — NAPROXEN 500 MG PO TABS: 500.0000 mg | ORAL_TABLET | Freq: Two times a day (BID) | ORAL | 2 refills | Status: DC

## 2018-01-31 NOTE — Progress Notes (Signed)
Subjective:     [name] [name] is a 47 y.o. female here for a routine exam. G3P2  S/p c/s x1 and SVD x1.  Current complaints: pain after endometrial ablation in 2017. Pt reports that the pain began gradually and now is severe with each clcyle for 3 days. Pt has been taking OTC Motrin 3 or 4 every 4-6 hours when she is in pain.   Pt denies bleeding since shortly after endometrial ablation.    Gynecologic History No LMP recorded. (Menstrual status: Other). Last PAP: 07/2013. Results were: normal Last mammogram: 08/2015. Results were: normal  Obstetric History OB History  Gravida Para Term Preterm AB Living  3 2 2   1 2   SAB TAB Ectopic Multiple Live Births  1            # Outcome Date GA Lbr Len/2nd Weight Sex Delivery Anes PTL Lv  3 SAB           2 Term           1 Term            The following portions of the patient's history were reviewed and updated as appropriate: allergies, current medications, past family history, past medical history, past social history, past surgical history and problem list.  Review of Systems Pertinent items are noted in HPI.    Objective:  BP 120/73   Pulse 69   Ht 5\' 6"  (1.676 m)   Wt 218 lb 12.8 oz (99.2 kg)   BMI 35.32 kg/m   General Appearance:    Alert, cooperative, no distress, appears stated age  Head:    Normocephalic, without obvious abnormality, atraumatic  Eyes:    conjunctiva/corneas clear, EOM's intact, both eyes  Ears:    Normal external ear canals, both ears  Nose:   Nares normal, septum midline, mucosa normal, no drainage    or sinus tenderness  Throat:   Lips, mucosa, and tongue normal; teeth and gums normal  Neck:   Supple, symmetrical, trachea midline, no adenopathy;    thyroid:  no enlargement/tenderness/nodules  Back:     Symmetric, no curvature, ROM normal, no CVA tenderness  Lungs:     Clear to auscultation bilaterally, respirations unlabored  Chest Wall:    No tenderness or deformity   Heart:    Regular rate and  rhythm, S1 and S2 normal, no murmur, rub   or gallop  Breast Exam:    No tenderness, masses, or nipple abnormality  Abdomen:     Soft, non-tender, bowel sounds active all four quadrants,    no masses, no organomegaly  Genitalia:    Normal female without lesion, discharge or tenderness; uterus is not enlarged or tender. No adnexal masses.       Extremities:   Extremities normal, atraumatic, no cyanosis or edema  Pulses:   2+ and symmetric all extremities  Skin:   Skin color, texture, turgor normal, no rashes or lesions    Assessment:    Healthy female exam.   Screening for breast cancer Pelvic pain- suspect adenomyosis after ablation. D/w pt tx options. Pt not interested in surgery. Will try conservative medical options.    Plan:    Mammogram ordered. Follow up in: 1 year.    Cataflam 50mg  1 po tid prn dysmenorrhea. Not ot exceed 4 tabs per day Do not take with Motrin or other NSAIDS.  Pt to f/u via telephone or visit if sx persist  Giovani Neumeister L. Harraway-Smith, M.D.,  FACOG

## 2018-02-01 LAB — CYTOLOGY - PAP
Diagnosis: NEGATIVE
HPV: NOT DETECTED

## 2018-02-11 ENCOUNTER — Encounter (HOSPITAL_BASED_OUTPATIENT_CLINIC_OR_DEPARTMENT_OTHER): Payer: Self-pay

## 2018-02-11 ENCOUNTER — Ambulatory Visit (HOSPITAL_BASED_OUTPATIENT_CLINIC_OR_DEPARTMENT_OTHER)
Admission: RE | Admit: 2018-02-11 | Discharge: 2018-02-11 | Disposition: A | Discharge status: Home / Self Care | Payer: Medicare Other | Source: Ambulatory Visit | Attending: Obstetrics & Gynecology | Admitting: Obstetrics & Gynecology

## 2018-02-11 DIAGNOSIS — Z1231 Encounter for screening mammogram for malignant neoplasm of breast: Secondary | ICD-10-CM | POA: Insufficient documentation

## 2018-02-11 DIAGNOSIS — Z1239 Encounter for other screening for malignant neoplasm of breast: Secondary | ICD-10-CM

## 2018-02-12 ENCOUNTER — Other Ambulatory Visit: Payer: Self-pay | Admitting: Obstetrics & Gynecology

## 2018-02-12 DIAGNOSIS — R928 Other abnormal and inconclusive findings on diagnostic imaging of breast: Secondary | ICD-10-CM

## 2018-02-15 ENCOUNTER — Ambulatory Visit
Admission: RE | Admit: 2018-02-15 | Discharge: 2018-02-15 | Disposition: A | Discharge status: Home / Self Care | Payer: Medicare Other | Source: Ambulatory Visit | Attending: Obstetrics & Gynecology | Admitting: Obstetrics & Gynecology

## 2018-02-15 ENCOUNTER — Ambulatory Visit: Payer: Medicare Other

## 2018-02-15 DIAGNOSIS — R928 Other abnormal and inconclusive findings on diagnostic imaging of breast: Secondary | ICD-10-CM

## 2018-04-17 ENCOUNTER — Other Ambulatory Visit: Payer: Self-pay

## 2018-04-17 ENCOUNTER — Encounter: Payer: Self-pay | Admitting: Hematology & Oncology

## 2018-04-17 ENCOUNTER — Inpatient Hospital Stay: Payer: Medicare Other

## 2018-04-17 ENCOUNTER — Telehealth: Payer: Self-pay | Admitting: *Deleted

## 2018-04-17 ENCOUNTER — Inpatient Hospital Stay: Payer: Medicare Other | Attending: Hematology & Oncology | Admitting: Hematology & Oncology

## 2018-04-17 VITALS — BP 116/76 | HR 62 | Temp 98.3°F | Resp 19 | Wt 220.0 lb

## 2018-04-17 DIAGNOSIS — D509 Iron deficiency anemia, unspecified: Secondary | ICD-10-CM

## 2018-04-17 DIAGNOSIS — D500 Iron deficiency anemia secondary to blood loss (chronic): Secondary | ICD-10-CM

## 2018-04-17 DIAGNOSIS — J156 Pneumonia due to other aerobic Gram-negative bacteria: Secondary | ICD-10-CM

## 2018-04-17 DIAGNOSIS — M0800 Unspecified juvenile rheumatoid arthritis of unspecified site: Secondary | ICD-10-CM

## 2018-04-17 LAB — CBC WITH DIFFERENTIAL (CANCER CENTER ONLY)
Basophils Absolute: 0 10*3/uL (ref 0.0–0.1)
Basophils Relative: 1 %
Eosinophils Absolute: 0.1 10*3/uL (ref 0.0–0.5)
Eosinophils Relative: 3 %
HCT: 37 % (ref 34.8–46.6)
Hemoglobin: 12.3 g/dL (ref 11.6–15.9)
Lymphocytes Relative: 41 %
Lymphs Abs: 1.6 10*3/uL (ref 0.9–3.3)
MCH: 30 pg (ref 26.0–34.0)
MCHC: 33.2 g/dL (ref 32.0–36.0)
MCV: 90.2 fL (ref 81.0–101.0)
Monocytes Absolute: 0.3 10*3/uL (ref 0.1–0.9)
Monocytes Relative: 8 %
Neutro Abs: 1.9 10*3/uL (ref 1.5–6.5)
Neutrophils Relative %: 47 %
Platelet Count: 311 10*3/uL (ref 145–400)
RBC: 4.1 MIL/uL (ref 3.70–5.32)
RDW: 11.9 % (ref 11.1–15.7)
WBC Count: 3.9 10*3/uL (ref 3.9–10.0)

## 2018-04-17 LAB — CMP (CANCER CENTER ONLY)
ALT: 22 U/L (ref 10–47)
AST: 22 U/L (ref 11–38)
Albumin: 3.6 g/dL (ref 3.5–5.0)
Alkaline Phosphatase: 50 U/L (ref 26–84)
Anion gap: 8 (ref 5–15)
BUN: 15 mg/dL (ref 7–22)
CO2: 27 mmol/L (ref 18–33)
Calcium: 9.1 mg/dL (ref 8.0–10.3)
Chloride: 106 mmol/L (ref 98–108)
Creatinine: 0.7 mg/dL (ref 0.60–1.20)
Glucose, Bld: 105 mg/dL (ref 73–118)
Potassium: 4.1 mmol/L (ref 3.3–4.7)
Sodium: 141 mmol/L (ref 128–145)
Total Bilirubin: 0.7 mg/dL (ref 0.2–1.6)
Total Protein: 7.1 g/dL (ref 6.4–8.1)

## 2018-04-17 LAB — IRON AND TIBC
Iron: 64 ug/dL (ref 41–142)
Saturation Ratios: 25 % (ref 21–57)
TIBC: 259 ug/dL (ref 236–444)
UIBC: 195 ug/dL

## 2018-04-17 LAB — LACTATE DEHYDROGENASE: LDH: 117 U/L (ref 98–192)

## 2018-04-17 LAB — FERRITIN: Ferritin: 86 ng/mL (ref 11–307)

## 2018-04-17 LAB — TECHNOLOGIST SMEAR REVIEW: Tech Review: NORMAL

## 2018-04-17 NOTE — Telephone Encounter (Addendum)
Patient is aware of results.   ----- Message from [name] Napoleon, MD sent at 04/17/2018  2:07 PM EDT ----- Call - iron level is ok!!  [name] [name]

## 2018-04-17 NOTE — Progress Notes (Signed)
Hematology and Oncology Follow Up Visit  [name] [name] [id number] July 27, 1971 47 y.o. 04/17/2018   Principle Diagnosis:  Bone marrow fibrosis Intermittent iron deficiency anemia Juvenile rheumatoid arthritis  Current Therapy:   IV iron as indicated    Interim History: [name] [name] is here today for follow-up.she is doing okay.  We last saw her back in January.  Since then, she is had really no problems.  She is seeing [facility] for the rheumatoid arthritis/myelofibrosis.  She has been on intermittent prednisone for any kind of arthritis flareup.  Her last iron studies back in February showed a ferritin of 86 with iron saturation of 30%.  She and her family were down in [facility] for vacation.  This was in July.  They had a good time.  She does help her daughter out at work.  Her daughter works for [employer], academic.  I think this is wonderful that she is trying to make a difference.  Overall, her performance status is ECOG 1.    Medications:  Allergies as of 04/17/2018      Reactions   Aspirin Itching, Shortness Of Breath   Coconut Oil Itching, Other (See Comments)   Sores in mouth Sores in mouth   Coconut Fatty Acids    Coconut in raw form      Medication List        Accurate as of 04/17/18  8:30 AM. Always use your most recent med list.          Acetaminophen-guaiFENesin 650-400 MG Tabs Take by mouth.   folic acid 1 MG tablet Commonly known as:  FOLVITE Take 1 mg by mouth daily.   multivitamin-iron-minerals-folic acid Tabs tablet Take 1 tablet by mouth daily.   naproxen 500 MG tablet Commonly known as:  NAPROSYN Take 1 tablet (500 mg total) by mouth 2 (two) times daily with a meal. As needed for pain   oxyCODONE 5 MG immediate release tablet Commonly known as:  Oxy IR/ROXICODONE Take by mouth every 6 (six) hours as needed.   Vitamin D (Ergocalciferol) 50000 units Caps capsule Commonly known as:  DRISDOL Take 50,000 Units by mouth every 7 (seven) days.       Allergies:  Allergies  Allergen Reactions  . Aspirin Itching and Shortness Of Breath  . Coconut Oil Itching and Other (See Comments)    Sores in mouth Sores in mouth   . Coconut Fatty Acids     Coconut in raw form    Past Medical History, Surgical history, Social history, and Family History were reviewed and updated.  Review of Systems: Review of Systems  Constitutional: Positive for malaise/fatigue.  HENT: Positive for congestion and sinus pain.   Eyes: Negative.   Respiratory: Positive for cough.   Cardiovascular: Negative.   Gastrointestinal: Negative.   Genitourinary: Negative.   Musculoskeletal: Positive for joint pain and myalgias.  Skin: Negative.   Neurological: Negative.   Endo/Heme/Allergies: Negative.   Psychiatric/Behavioral: Negative.      Physical Exam:  weight is 220 lb (99.8 kg). Her oral temperature is 98.3 F (36.8 C). Her blood pressure is 116/76 and her pulse is 62. Her respiration is 19 and oxygen saturation is 100%.   Wt Readings from Last 3 Encounters:  04/17/18 220 lb (99.8 kg)  01/31/18 218 lb 12.8 oz (99.2 kg)  10/17/17 213 lb (96.6 kg)    Physical Exam  Constitutional: She is oriented to person, place, and time.  HENT:  Head: Normocephalic and atraumatic.  Mouth/Throat:  Oropharynx is clear and moist.  Eyes: Pupils are equal, round, and reactive to light. EOM are normal.  Neck: Normal range of motion.  Cardiovascular: Normal rate, regular rhythm and normal heart sounds.  Pulmonary/Chest: Effort normal and breath sounds normal.  Abdominal: Soft. Bowel sounds are normal.  Musculoskeletal: Normal range of motion. She exhibits no edema, tenderness or deformity.  Lymphadenopathy:    She has no cervical adenopathy.  Neurological: She is alert and oriented to person, place, and time.  Skin: Skin is warm and dry. No rash noted. No erythema.  Psychiatric: She has a normal mood and affect. Her behavior is normal. Judgment and thought  content normal.  Vitals reviewed.  Lab Results  Component Value Date   WBC 3.9 04/17/2018   HGB 12.3 04/17/2018   HCT 37.0 04/17/2018   MCV 90.2 04/17/2018   PLT 311 04/17/2018   Lab Results  Component Value Date   FERRITIN 86 10/17/2017   IRON 75 10/17/2017   TIBC 250 10/17/2017   UIBC 176 10/17/2017   IRONPCTSAT 30 10/17/2017   Lab Results  Component Value Date   RETICCTPCT 1.2 10/17/2017   RBC 4.10 04/17/2018   RETICCTABS 34.9 04/15/2015   No results found for: KPAFRELGTCHN, LAMBDASER, KAPLAMBRATIO No results found for: IGGSERUM, IGA, IGMSERUM No results found for: Kathrynn Ducking, MSPIKE, SPEI   Chemistry      Component Value Date/Time   NA 138 10/17/2017 0742   NA 139 04/16/2017 0810   K 4.0 10/17/2017 0742   K 4.3 04/16/2017 0810   CL 104 10/17/2017 0742   CL 105 10/18/2015 0940   CO2 24 10/17/2017 0742   CO2 24 04/16/2017 0810   BUN 14 10/17/2017 0742   BUN 11.5 04/16/2017 0810   CREATININE 0.86 10/17/2017 0742   CREATININE 0.8 04/16/2017 0810      Component Value Date/Time   CALCIUM 9.4 10/17/2017 0742   CALCIUM 9.4 04/16/2017 0810   ALKPHOS 47 10/17/2017 0742   ALKPHOS 46 04/16/2017 0810   AST 16 10/17/2017 0742   AST 18 04/16/2017 0810   ALT 13 10/17/2017 0742   ALT 15 04/16/2017 0810   BILITOT 0.4 10/17/2017 0742   BILITOT 0.45 04/16/2017 0810     Impression and Plan:[name] [name] is 47 yo old [place] American female with intermittent iron deficiency anemia secondary to bone marrow fibrosis.  From my point of view, things look pretty good.  We will see what her iron studies show.  I would like to think that her iron level will be okay.  I will plan to see her back in 6 months.  [name], MD 8/7/20198:30 AM

## 2018-07-15 DIAGNOSIS — D899 Disorder involving the immune mechanism, unspecified: Secondary | ICD-10-CM | POA: Diagnosis not present

## 2018-07-15 DIAGNOSIS — D7581 Myelofibrosis: Secondary | ICD-10-CM | POA: Diagnosis not present

## 2018-09-25 ENCOUNTER — Other Ambulatory Visit: Payer: Self-pay | Admitting: Family Medicine

## 2018-09-25 DIAGNOSIS — R109 Unspecified abdominal pain: Secondary | ICD-10-CM

## 2018-09-25 DIAGNOSIS — R197 Diarrhea, unspecified: Secondary | ICD-10-CM

## 2018-09-26 ENCOUNTER — Ambulatory Visit
Admission: RE | Admit: 2018-09-26 | Discharge: 2018-09-26 | Disposition: A | Discharge status: Home / Self Care | Payer: Medicare Other | Source: Ambulatory Visit | Attending: Family Medicine | Admitting: Family Medicine

## 2018-09-26 DIAGNOSIS — R197 Diarrhea, unspecified: Secondary | ICD-10-CM

## 2018-09-26 DIAGNOSIS — R109 Unspecified abdominal pain: Secondary | ICD-10-CM

## 2018-10-09 ENCOUNTER — Ambulatory Visit: Payer: Medicare Other | Admitting: Obstetrics & Gynecology

## 2018-10-17 ENCOUNTER — Telehealth: Payer: Self-pay | Admitting: Hematology & Oncology

## 2018-10-17 NOTE — Telephone Encounter (Signed)
Spoke with patient regarding r/s appointment per patient r/s to 2/11 w/ [facility] per verbal request from Dr Marin Olp

## 2018-10-18 ENCOUNTER — Ambulatory Visit: Payer: Medicare Other | Admitting: Family

## 2018-10-18 ENCOUNTER — Other Ambulatory Visit: Payer: Medicare Other

## 2018-10-22 ENCOUNTER — Inpatient Hospital Stay: Payer: Medicare Other | Attending: Hematology & Oncology

## 2018-10-22 ENCOUNTER — Inpatient Hospital Stay: Payer: Medicare Other | Admitting: Family

## 2019-12-02 ENCOUNTER — Other Ambulatory Visit: Payer: Self-pay

## 2019-12-02 ENCOUNTER — Encounter (HOSPITAL_BASED_OUTPATIENT_CLINIC_OR_DEPARTMENT_OTHER): Payer: Self-pay | Admitting: *Deleted

## 2019-12-02 ENCOUNTER — Emergency Department (HOSPITAL_BASED_OUTPATIENT_CLINIC_OR_DEPARTMENT_OTHER): Payer: Medicare Other

## 2019-12-02 ENCOUNTER — Emergency Department (HOSPITAL_BASED_OUTPATIENT_CLINIC_OR_DEPARTMENT_OTHER)
Admission: EM | Admit: 2019-12-02 | Discharge: 2019-12-02 | Disposition: A | Discharge status: Home / Self Care | Payer: Medicare Other | Attending: Emergency Medicine | Admitting: Emergency Medicine

## 2019-12-02 DIAGNOSIS — Z91018 Allergy to other foods: Secondary | ICD-10-CM | POA: Diagnosis not present

## 2019-12-02 DIAGNOSIS — Z79899 Other long term (current) drug therapy: Secondary | ICD-10-CM | POA: Insufficient documentation

## 2019-12-02 DIAGNOSIS — R0609 Other forms of dyspnea: Secondary | ICD-10-CM | POA: Diagnosis not present

## 2019-12-02 DIAGNOSIS — Z7902 Long term (current) use of antithrombotics/antiplatelets: Secondary | ICD-10-CM | POA: Diagnosis not present

## 2019-12-02 DIAGNOSIS — Z886 Allergy status to analgesic agent status: Secondary | ICD-10-CM | POA: Insufficient documentation

## 2019-12-02 DIAGNOSIS — R0789 Other chest pain: Secondary | ICD-10-CM | POA: Diagnosis not present

## 2019-12-02 DIAGNOSIS — R0600 Dyspnea, unspecified: Secondary | ICD-10-CM

## 2019-12-02 HISTORY — DX: Myelofibrosis: D75.81

## 2019-12-02 LAB — BASIC METABOLIC PANEL
Anion gap: 7 (ref 5–15)
BUN: 15 mg/dL (ref 6–20)
CO2: 26 mmol/L (ref 22–32)
Calcium: 9.2 mg/dL (ref 8.9–10.3)
Chloride: 105 mmol/L (ref 98–111)
Creatinine, Ser: 0.76 mg/dL (ref 0.44–1.00)
GFR calc Af Amer: >60 mL/min (ref >60)
GFR calc non Af Amer: >60 mL/min (ref >60)
Glucose, Bld: 91 mg/dL (ref 70–99)
Potassium: 3.8 mmol/L (ref 3.5–5.1)
Sodium: 138 mmol/L (ref 135–145)

## 2019-12-02 LAB — CBC
HCT: 38.4 % (ref 36.0–46.0)
Hemoglobin: 12.6 g/dL (ref 12.0–15.0)
MCH: 30.4 pg (ref 26.0–34.0)
MCHC: 32.8 g/dL (ref 30.0–36.0)
MCV: 92.8 fL (ref 80.0–100.0)
Platelets: 331 10*3/uL (ref 150–400)
RBC: 4.14 MIL/uL (ref 3.87–5.11)
RDW: 11.9 % (ref 11.5–15.5)
WBC: 5.7 10*3/uL (ref 4.0–10.5)
nRBC: 0 % (ref 0.0–0.2)

## 2019-12-02 LAB — TROPONIN I (HIGH SENSITIVITY)
Troponin I (High Sensitivity): 2 ng/L (ref <18)
Troponin I (High Sensitivity): 2 ng/L (ref <18)

## 2019-12-02 MED ORDER — LIDOCAINE VISCOUS HCL 2 % MT SOLN
15.0000 mL | Freq: Once | OROMUCOSAL | Status: AC
  Administered 2019-12-02: 15 mL via ORAL
  Filled 2019-12-02: qty 15

## 2019-12-02 MED ORDER — ALUM & MAG HYDROXIDE-SIMETH 200-200-20 MG/5ML PO SUSP
30.0000 mL | Freq: Once | ORAL | Status: AC
  Administered 2019-12-02: 30 mL via ORAL
  Filled 2019-12-02: qty 30

## 2019-12-02 NOTE — ED Provider Notes (Signed)
[facility] EMERGENCY DEPARTMENT Provider Note   CSN: [id number] Arrival date & time: 12/02/19  1441     History Chief Complaint  Patient presents with  . Chest Pain    [name] [name] is a 49 y.o. female.  Patient is a 48 year old female with complicated past medical history including myelofibrosis, anemia, rheumatoid arthritis presenting to the emergency department for chest pain and shortness of breath for the past 6 weeks.  Patient reports that the symptoms are intermittent.  Denies any exacerbating or relieving symptoms of her chest pain.  [name] [name] described as tightness in the center of her chest with occasional feeling like a knot is in her throat.  Occasional productive cough.  [name] [name] reports that [name] [name] has dyspnea on exertion and is only able to walk short distances without feeling short of breath.  [name] [name] reports that her primary care doctor has given her 3 different medications for the treatment of GERD due to the symptoms.  [name] [name] is unsure of these medications.  [name] [name] just finished a prednisone taper today.  Denies any leg swelling, dizziness, syncope.  Denies any fever, chills or URI symptoms.        Past Medical History:  Diagnosis Date  . Anemia   . Arthritis    Myleofibrosis- bone marrow deteriorating  . Blood transfusion without reported diagnosis   . Myelofibrosis ([place])   . Pericarditis 2004-2007   Reoccuring: without flare since 2007  . Shortness of breath dyspnea    on exertion when having flare up of mylofibrosis or iron deficient    Patient Active Problem List   Diagnosis Date Noted  . Abnormal uterine bleeding (AUB) 04/20/2015  . Myelofibrosis ([place]) 08/13/2013  . Lipoma of axilla 08/12/2013  . Anemia, iron deficiency 07/11/2011    Past Surgical History:  Procedure Laterality Date  . BREAST EXCISIONAL BIOPSY Right 2017  . CESAREAN SECTION     x1  . DILITATION & CURRETTAGE/HYSTROSCOPY WITH NOVASURE ABLATION N/A 04/20/2015   Procedure: DILATATION &  CURETTAGE/HYSTEROSCOPY WITH [place] ABLATION/RESECTION FIBROID WITH MYOSURE;  Surgeon: Lavonia Drafts, MD;  Location: [facility];  Service: Gynecology;  Laterality: N/A;  . GREAT TOE ARTHRODESIS, INTERPHALANGEAL JOINT  2011   left  . LIPOMA EXCISION Right 06/26/2014   Procedure: EXCISION RIGHT AXILLA  LIPOMA;  Surgeon: Doreen Salvage, MD;  Location: [facility];  Service: General;  Laterality: Right;  . PORT A CATH REVISION     insertion-[facility]  . PORT-A-CATH REMOVAL Right 06/26/2014   Procedure: REMOVAL PORT-A-CATH;  Surgeon: Doreen Salvage, MD;  Location: [facility];  Service: General;  Laterality: Right;     OB History    Gravida  3   Para  2   Term  2   Preterm      AB  1   Living  2     SAB  1   TAB      Ectopic      Multiple      Live Births              Family History  Problem Relation Age of Onset  . Diabetes Mother   . Varicose Veins Mother   . Diabetes Paternal Uncle   . Hypertension Paternal Uncle   . Cancer Maternal Grandmother   . Breast cancer Maternal Grandmother 25    Social History   Tobacco Use  . Smoking status: Never Smoker  . Smokeless tobacco: Never Used  . Tobacco comment: never used  tobacco  Substance Use Topics  . Alcohol use: No    Alcohol/week: 0.0 standard drinks  . Drug use: No    Home Medications Prior to Admission medications   Medication Sig Start Date End Date Taking? Authorizing Provider  diclofenac Sodium (VOLTAREN) 1 % GEL Apply topically. 06/18/19 06/17/20 Yes [provider]  ergocalciferol (VITAMIN D2) 1.25 MG (50000 UT) capsule Take by mouth. 09/24/19 03/22/20 Yes [provider]  hydroxychloroquine (PLAQUENIL) 200 MG tablet Take by mouth. 09/23/19 06/19/20 Yes [provider]  ketoconazole (NIZORAL) 2 % cream Apply topically. 02/17/19  Yes [provider]  predniSONE (DELTASONE) 5 MG tablet 30mg  (6 pills) daily x3 days, 15mg  (3 pills) daily x1 week, 10mg  (2  pills) daily x1 week, 5mg  (1 pill) daily x1 week 01/03/19  Yes [provider]  Acetaminophen-Guaifenesin 650-400 MG TABS Take by mouth. 07/26/09   [provider]  folic acid (FOLVITE) 1 MG tablet Take 1 mg by mouth daily.    [provider]  multivitamin-iron-minerals-folic acid (THERAPEUTIC-M) TABS tablet Take 1 tablet by mouth daily.    [provider]  naproxen (NAPROSYN) 500 MG tablet Take 1 tablet (500 mg total) by mouth 2 (two) times daily with a meal. As needed for pain 01/31/18   Lavonia Drafts, MD  oxyCODONE (OXY IR/ROXICODONE) 5 MG immediate release tablet Take by mouth every 6 (six) hours as needed.  06/25/14   [provider]  Vitamin D, Ergocalciferol, (DRISDOL) 50000 units CAPS capsule Take 50,000 Units by mouth every 7 (seven) days.    [provider]    Allergies    Aspirin, Coconut oil, and Coconut fatty acids  Review of Systems   Review of Systems  Constitutional: Negative for appetite change, chills and fever.  HENT: Negative for congestion, ear pain, rhinorrhea, sinus pain and sore throat.   Eyes: Negative for pain and visual disturbance.  Respiratory: Positive for cough, chest tightness and shortness of breath.   Cardiovascular: Positive for chest pain. Negative for palpitations and leg swelling.  Gastrointestinal: Negative for abdominal pain, diarrhea, nausea and vomiting.  Genitourinary: Negative for dysuria and hematuria.  Musculoskeletal: Negative for arthralgias and back pain.  Skin: Negative for color change and rash.  Neurological: Negative for dizziness, seizures, syncope, speech difficulty and headaches.  All other systems reviewed and are negative.   Physical Exam Updated Vital Signs BP 111/79 (BP Location: Left Arm)   Pulse 70   Temp 98 F (36.7 C) (Oral)   Resp (!) 22   Ht 5\' 6"  (1.676 m)   Wt 96.2 kg   SpO2 100%   BMI 34.22 kg/m   Physical Exam Vitals and nursing note reviewed.    Constitutional:      General: [name] [name] is not in acute distress.    Appearance: Normal appearance. [name] [name] is well-developed. [name] [name] is not ill-appearing, toxic-appearing or diaphoretic.  HENT:     Head: Normocephalic.  Eyes:     Conjunctiva/sclera: Conjunctivae normal.  Cardiovascular:     Rate and Rhythm: Normal rate and regular rhythm.     Comments: No pericardial rub Pulmonary:     Effort: Pulmonary effort is normal.     Breath sounds: Normal breath sounds.  Musculoskeletal:     Right lower leg: No edema.     Left lower leg: No edema.  Skin:    General: Skin is warm and dry.  Neurological:     Mental Status: [name] [name] is alert.  Psychiatric:  Mood and Affect: Mood normal.     ED Results / Procedures / Treatments   Labs (all labs ordered are listed, but only abnormal results are displayed) Labs Reviewed  BASIC METABOLIC PANEL  CBC  TROPONIN I (HIGH SENSITIVITY)  TROPONIN I (HIGH SENSITIVITY)    EKG EKG Interpretation  Date/Time:  Tuesday December 02 2019 14:46:49 EDT Ventricular Rate:  66 PR Interval:  142 QRS Duration: 82 QT Interval:  394 QTC Calculation: 413 R Axis:   77 Text Interpretation: Normal sinus rhythm Normal ECG Confirmed by Lennice Sites [phone]) on 12/02/2019 5:00:35 PM   Radiology DG Chest Port 1 View  Result Date: 12/02/2019 CLINICAL DATA:  Shortness of breath, chest pain. EXAM: PORTABLE CHEST 1 VIEW COMPARISON:  Jan 12, 2016. FINDINGS: The heart size and mediastinal contours are within normal limits. No pneumothorax or pleural effusion is noted. Minimal bibasilar subsegmental atelectasis is noted. The visualized skeletal structures are unremarkable. IMPRESSION: Minimal bibasilar subsegmental atelectasis. Electronically Signed   By: Marijo Conception M.D.   On: 12/02/2019 15:30    Procedures Procedures (including critical care time)  Medications Ordered in ED Medications  alum & mag hydroxide-simeth (MAALOX/MYLANTA) 200-200-20 MG/5ML suspension 30 mL (30  mLs Oral Given 12/02/19 1641)    And  lidocaine (XYLOCAINE) 2 % viscous mouth solution 15 mL (15 mLs Oral Given 12/02/19 1641)    ED Course  I have reviewed the triage vital signs and the nursing notes.  Pertinent labs & imaging results that were available during my care of the patient were reviewed by me and considered in my medical decision making (see chart for details).  Clinical Course as of Dec 01 1745  Tue Dec 02, 2019  1744 Patient with atypical sounding chest pain off and on for 6 weeks. PERC negative, HEART score of 2. Trop normal x2. EKG, chest xray and remaining labs are reassuring. Discussed with Dr. Ronnald Nian and plan to d/c home with f/u with cardiology/PMD   [KM]    Clinical Course User Index [KM] Alveria Apley, PA-C   MDM Rules/Calculators/A&P                     Based on review of vitals, medical screening exam, lab work and/or imaging, there does not appear to be an acute, emergent etiology for the patient's symptoms. Counseled pt on good return precautions and encouraged both PCP and ED follow-up as needed.  Prior to discharge, I also discussed incidental imaging findings with patient in detail and advised appropriate, recommended follow-up in detail.  Clinical Impression: 1. Atypical chest pain   2. Dyspnea, unspecified type     Disposition: Discharge  Prior to providing a prescription for a controlled substance, I independently reviewed the patient's recent prescription history on the [facility]. The patient had no recent or regular prescriptions and was deemed appropriate for a brief, less than 3 day prescription of narcotic for acute analgesia.  This note was prepared with assistance of Systems analyst. Occasional wrong-word or sound-a-like substitutions may have occurred due to the inherent limitations of voice recognition software.  Final Clinical Impression(s) / ED Diagnoses Final diagnoses:    Atypical chest pain  Dyspnea, unspecified type    Rx / DC Orders ED Discharge Orders    None       Alveria Apley, PA-C 12/02/19 1747    Lennice Sites, DO 12/02/19 2312

## 2019-12-02 NOTE — ED Notes (Signed)
PT ambulated with steady gait and no assistance. PT O2 SATs remained above 93% throughout ambulation.

## 2019-12-02 NOTE — ED Notes (Signed)
Pt ambulatory at discharge, verbalizes understanding of instructions and to follow up with PCP and Cardiology. Signature pad in room not working.

## 2019-12-02 NOTE — ED Triage Notes (Signed)
Tightness and heaviness in the center of her chest on and off for 6 weeks. Hx of pericarditis. She was given steroids 6 weeks ago. She was given a rx for prednisone 4 days ago with no relief. She gets SOB when she lays down.

## 2019-12-02 NOTE — Discharge Instructions (Signed)
You were seen today for chest pain and shortness of breath. Fortunately we did not find an emergency today. Your EKG, chest xray, cardiac enzymes and other labs were reassuring. We would like for you to follow up with your primary care provider and/or a cardiologist for further evaluation. Thank you for allowing me to care for you today. Please return to the emergency department if you have new or worsening symptoms. Take your medications as instructed.

## 2019-12-10 ENCOUNTER — Other Ambulatory Visit: Payer: Self-pay

## 2019-12-10 DIAGNOSIS — D500 Iron deficiency anemia secondary to blood loss (chronic): Secondary | ICD-10-CM

## 2019-12-11 ENCOUNTER — Inpatient Hospital Stay (HOSPITAL_BASED_OUTPATIENT_CLINIC_OR_DEPARTMENT_OTHER): Payer: Medicare Other | Admitting: Hematology & Oncology

## 2019-12-11 ENCOUNTER — Encounter: Payer: Self-pay | Admitting: Hematology & Oncology

## 2019-12-11 ENCOUNTER — Encounter (HOSPITAL_BASED_OUTPATIENT_CLINIC_OR_DEPARTMENT_OTHER): Payer: Self-pay

## 2019-12-11 ENCOUNTER — Other Ambulatory Visit: Payer: Self-pay

## 2019-12-11 ENCOUNTER — Ambulatory Visit (HOSPITAL_BASED_OUTPATIENT_CLINIC_OR_DEPARTMENT_OTHER)
Admission: RE | Admit: 2019-12-11 | Discharge: 2019-12-11 | Disposition: A | Discharge status: Home / Self Care | Payer: Medicare Other | Source: Ambulatory Visit | Attending: Hematology & Oncology | Admitting: Hematology & Oncology

## 2019-12-11 ENCOUNTER — Inpatient Hospital Stay: Payer: Medicare Other | Attending: Hematology & Oncology

## 2019-12-11 ENCOUNTER — Other Ambulatory Visit: Payer: Self-pay | Admitting: Family

## 2019-12-11 VITALS — BP 111/85 | HR 65 | Temp 96.8°F | Resp 18 | Wt 218.0 lb

## 2019-12-11 DIAGNOSIS — D7581 Myelofibrosis: Secondary | ICD-10-CM

## 2019-12-11 DIAGNOSIS — Z886 Allergy status to analgesic agent status: Secondary | ICD-10-CM | POA: Diagnosis not present

## 2019-12-11 DIAGNOSIS — D509 Iron deficiency anemia, unspecified: Secondary | ICD-10-CM | POA: Insufficient documentation

## 2019-12-11 DIAGNOSIS — D500 Iron deficiency anemia secondary to blood loss (chronic): Secondary | ICD-10-CM

## 2019-12-11 DIAGNOSIS — M0800 Unspecified juvenile rheumatoid arthritis of unspecified site: Secondary | ICD-10-CM | POA: Insufficient documentation

## 2019-12-11 DIAGNOSIS — R1310 Dysphagia, unspecified: Secondary | ICD-10-CM | POA: Diagnosis not present

## 2019-12-11 DIAGNOSIS — Z79899 Other long term (current) drug therapy: Secondary | ICD-10-CM | POA: Insufficient documentation

## 2019-12-11 LAB — CMP (CANCER CENTER ONLY)
ALT: 11 U/L (ref 0–44)
AST: 12 U/L — ABNORMAL LOW (ref 15–41)
Albumin: 4.2 g/dL (ref 3.5–5.0)
Alkaline Phosphatase: 47 U/L (ref 38–126)
Anion gap: 6 (ref 5–15)
BUN: 20 mg/dL (ref 6–20)
CO2: 30 mmol/L (ref 22–32)
Calcium: 9.9 mg/dL (ref 8.9–10.3)
Chloride: 106 mmol/L (ref 98–111)
Creatinine: 0.78 mg/dL (ref 0.44–1.00)
GFR, Est AFR Am: >60 mL/min (ref >60)
GFR, Estimated: >60 mL/min (ref >60)
Glucose, Bld: 100 mg/dL — ABNORMAL HIGH (ref 70–99)
Potassium: 4.6 mmol/L (ref 3.5–5.1)
Sodium: 142 mmol/L (ref 135–145)
Total Bilirubin: 0.4 mg/dL (ref 0.3–1.2)
Total Protein: 6.7 g/dL (ref 6.5–8.1)

## 2019-12-11 LAB — CBC WITH DIFFERENTIAL (CANCER CENTER ONLY)
Abs Immature Granulocytes: 0.01 10*3/uL (ref 0.00–0.07)
Basophils Absolute: 0 10*3/uL (ref 0.0–0.1)
Basophils Relative: 1 %
Eosinophils Absolute: 0.1 10*3/uL (ref 0.0–0.5)
Eosinophils Relative: 2 %
HCT: 38.4 % (ref 36.0–46.0)
Hemoglobin: 12.6 g/dL (ref 12.0–15.0)
Immature Granulocytes: 0 %
Lymphocytes Relative: 41 %
Lymphs Abs: 2.1 10*3/uL (ref 0.7–4.0)
MCH: 30.4 pg (ref 26.0–34.0)
MCHC: 32.8 g/dL (ref 30.0–36.0)
MCV: 92.5 fL (ref 80.0–100.0)
Monocytes Absolute: 0.3 10*3/uL (ref 0.1–1.0)
Monocytes Relative: 5 %
Neutro Abs: 2.6 10*3/uL (ref 1.7–7.7)
Neutrophils Relative %: 51 %
Platelet Count: 336 10*3/uL (ref 150–400)
RBC: 4.15 MIL/uL (ref 3.87–5.11)
RDW: 11.9 % (ref 11.5–15.5)
WBC Count: 5.1 10*3/uL (ref 4.0–10.5)
nRBC: 0 % (ref 0.0–0.2)

## 2019-12-11 LAB — LACTATE DEHYDROGENASE: LDH: 131 U/L (ref 98–192)

## 2019-12-11 LAB — SAVE SMEAR(SSMR), FOR PROVIDER SLIDE REVIEW

## 2019-12-11 MED ORDER — IOHEXOL 300 MG/ML  SOLN
100.0000 mL | Freq: Once | INTRAMUSCULAR | Status: AC | PRN
  Administered 2019-12-11: 15:00:00 80 mL via INTRAVENOUS

## 2019-12-11 NOTE — Progress Notes (Signed)
Hematology and Oncology Follow Up Visit  [name] [name] [id number] 05/15/1971 49 y.o. 12/11/2019   Principle Diagnosis:  Bone marrow fibrosis Intermittent iron deficiency anemia Juvenile rheumatoid arthritis  Current Therapy:   IV iron as indicated    Interim History: [name] [name] is here today for a long awaited follow-up.  We last saw her back in 2019.  Apparently, the problem now is that she is having some dysphagia.  This started a month or so ago.  She says that when she swallows, a lot of times food seems to get stuck.  She does not throw back up.  He just feels painful.  It eventually goes down.  She went to the emergency room a week or so ago.  She has similar complaints.  They did a chest x-ray on her.  This would not show anything.  She does have bone marrow fibrosis.  She does have juvenile rheumatoid arthritis.  She has this undifferentiated mixed connective tissue disorder.  She is followed at [facility] for this.  She has had no rashes.  There is been no weight loss.  She has had no change in bowel or bladder habits.  She has had no cough.  She had a mammogram back in October 2020.  She said this was okay.  She has had no bleeding.  Overall, I would say her performance status is ECOG 1.     Medications:  Allergies as of 12/11/2019      Reactions   Aspirin Itching, Shortness Of Breath   Coconut Oil Itching, Other (See Comments)   Sores in mouth Sores in mouth   Coconut Fatty Acids    Coconut in raw form      Medication List       Accurate as of December 11, 2019  1:53 PM. If you have any questions, ask your nurse or doctor.        Acetaminophen-guaiFENesin 650-400 MG Tabs Take by mouth.   diclofenac Sodium 1 % Gel Commonly known as: VOLTAREN Apply topically.   folic acid 1 MG tablet Commonly known as: FOLVITE Take 1 mg by mouth daily.   hydroxychloroquine 200 MG tablet Commonly known as: PLAQUENIL Take by mouth.   ketoconazole 2 % cream Commonly  known as: NIZORAL Apply topically.   multivitamin-iron-minerals-folic acid Tabs tablet Take 1 tablet by mouth daily.   naproxen 500 MG tablet Commonly known as: NAPROSYN Take 1 tablet (500 mg total) by mouth 2 (two) times daily with a meal. As needed for pain   oxyCODONE 5 MG immediate release tablet Commonly known as: Oxy IR/ROXICODONE Take by mouth every 6 (six) hours as needed.   predniSONE 5 MG tablet Commonly known as: DELTASONE 30mg  (6 pills) daily x3 days, 15mg  (3 pills) daily x1 week, 10mg  (2 pills) daily x1 week, 5mg  (1 pill) daily x1 week   Vitamin D (Ergocalciferol) 1.25 MG (50000 UNIT) Caps capsule Commonly known as: DRISDOL Take 50,000 Units by mouth every 7 (seven) days.   ergocalciferol 1.25 MG (50000 UT) capsule Commonly known as: VITAMIN D2 Take by mouth.       Allergies:  Allergies  Allergen Reactions  . Aspirin Itching and Shortness Of Breath  . Coconut Oil Itching and Other (See Comments)    Sores in mouth Sores in mouth   . Coconut Fatty Acids     Coconut in raw form    Past Medical History, Surgical history, Social history, and Family History were reviewed and updated.  Review  of Systems: Review of Systems  Constitutional: Positive for malaise/fatigue.  HENT: Positive for congestion and sinus pain.   Eyes: Negative.   Respiratory: Positive for cough.   Cardiovascular: Negative.   Gastrointestinal: Negative.   Genitourinary: Negative.   Musculoskeletal: Positive for joint pain and myalgias.  Skin: Negative.   Neurological: Negative.   Endo/Heme/Allergies: Negative.   Psychiatric/Behavioral: Negative.      Physical Exam:  weight is 218 lb (98.9 kg). Her temporal temperature is 96.8 F (36 C) (abnormal). Her blood pressure is 111/85 and her pulse is 65. Her respiration is 18 and oxygen saturation is 100%.   Wt Readings from Last 3 Encounters:  12/11/19 218 lb (98.9 kg)  12/02/19 212 lb (96.2 kg)  04/17/18 220 lb (99.8 kg)     Physical Exam Vitals reviewed.  HENT:     Head: Normocephalic and atraumatic.  Eyes:     Pupils: Pupils are equal, round, and reactive to light.  Cardiovascular:     Rate and Rhythm: Normal rate and regular rhythm.     Heart sounds: Normal heart sounds.  Pulmonary:     Effort: Pulmonary effort is normal.     Breath sounds: Normal breath sounds.  Abdominal:     General: Bowel sounds are normal.     Palpations: Abdomen is soft.  Musculoskeletal:        General: No tenderness or deformity. Normal range of motion.     Cervical back: Normal range of motion.  Lymphadenopathy:     Cervical: No cervical adenopathy.  Skin:    General: Skin is warm and dry.     Findings: No erythema or rash.  Neurological:     Mental Status: She is alert and oriented to person, place, and time.  Psychiatric:        Behavior: Behavior normal.        Thought Content: Thought content normal.        Judgment: Judgment normal.    Lab Results  Component Value Date   WBC 5.1 12/11/2019   HGB 12.6 12/11/2019   HCT 38.4 12/11/2019   MCV 92.5 12/11/2019   PLT 336 12/11/2019   Lab Results  Component Value Date   FERRITIN 86 04/17/2018   IRON 64 04/17/2018   TIBC 259 04/17/2018   UIBC 195 04/17/2018   IRONPCTSAT 25 04/17/2018   Lab Results  Component Value Date   RETICCTPCT 1.2 10/17/2017   RBC 4.15 12/11/2019   RETICCTABS 34.9 04/15/2015   No results found for: KPAFRELGTCHN, LAMBDASER, KAPLAMBRATIO No results found for: IGGSERUM, IGA, IGMSERUM No results found for: Odetta Pink, SPEI   Chemistry      Component Value Date/Time   NA 142 12/11/2019 1256   NA 139 04/16/2017 0810   K 4.6 12/11/2019 1256   K 4.3 04/16/2017 0810   CL 106 12/11/2019 1256   CL 105 10/18/2015 0940   CO2 30 12/11/2019 1256   CO2 24 04/16/2017 0810   BUN 20 12/11/2019 1256   BUN 11.5 04/16/2017 0810   CREATININE 0.78 12/11/2019 1256   CREATININE 0.8  04/16/2017 0810      Component Value Date/Time   CALCIUM 9.9 12/11/2019 1256   CALCIUM 9.4 04/16/2017 0810   ALKPHOS 47 12/11/2019 1256   ALKPHOS 46 04/16/2017 0810   AST 12 (L) 12/11/2019 1256   AST 18 04/16/2017 0810   ALT 11 12/11/2019 1256   ALT 15 04/16/2017 0810   BILITOT  0.4 12/11/2019 1256   BILITOT 0.45 04/16/2017 0810     Impression and Plan:Ms. Champoux is 49 yo old [place] American female with intermittent iron deficiency anemia secondary to bone marrow fibrosis.  I am not sure exactly what the problem is.  It certainly sounds like there might be some type of esophageal issue.  I know that with this rheumatologic issue, I suppose she might have some esophageal involvement.  I think she clearly needs to have a barium swallow done.  We will see if this shows any type of the stricture.  We will see if this shows any type of abnormal esophageal motility.  I also think a CT scan of the chest would not be a bad idea.  I would not think that there would be an encounter adenopathy.  However, there might be something in the mediastinum that has not been picked up on a chest x-ray.  Her labs look okay.  I really do not see anything with the labs that would point to a particular problem.  Hopefully, we will be able to help her out and get rid of this dysphagia that she has.  This clearly is affecting her quality of life.  We will see with the radiographic studies show.  I will then call her with the results.  We will see how everything looks.  I spent about 40 minutes with her today.  From my point of view, things look pretty good.  We will see what her iron studies show.  I would like to think that her iron level will be okay.  I will plan to see her back in 6 months.  [name], MD 4/1/20211:53 PM

## 2019-12-12 ENCOUNTER — Telehealth: Payer: Self-pay | Admitting: *Deleted

## 2019-12-12 LAB — IRON AND TIBC
Iron: 82 ug/dL (ref 41–142)
Saturation Ratios: 35 % (ref 21–57)
TIBC: 235 ug/dL — ABNORMAL LOW (ref 236–444)
UIBC: 153 ug/dL (ref 120–384)

## 2019-12-12 LAB — FERRITIN: Ferritin: 107 ng/mL (ref 11–307)

## 2019-12-12 NOTE — Telephone Encounter (Signed)
-----   Message from [name], MD sent at 12/11/2019  4:54 PM EDT ----- Call - the CT scan of the chest looks ok!!!  Nothing obvious to be causing the pain!!  [name] [name]

## 2019-12-12 NOTE — Telephone Encounter (Signed)
Patient notified per order of Dr. Marin Olp that "the CT scan of the chest looks ok!!  Nothing obvious to be causing the pain!!"  Pt appreciative of call and has no questions or concerns at this time.

## 2019-12-25 ENCOUNTER — Telehealth: Payer: Self-pay | Admitting: *Deleted

## 2019-12-25 ENCOUNTER — Other Ambulatory Visit: Payer: Self-pay

## 2019-12-25 ENCOUNTER — Ambulatory Visit (HOSPITAL_COMMUNITY)
Admission: RE | Admit: 2019-12-25 | Discharge: 2019-12-25 | Disposition: A | Discharge status: Home / Self Care | Payer: Medicare Other | Source: Ambulatory Visit | Attending: Hematology & Oncology | Admitting: Hematology & Oncology

## 2019-12-25 DIAGNOSIS — R1310 Dysphagia, unspecified: Secondary | ICD-10-CM

## 2019-12-25 DIAGNOSIS — D7581 Myelofibrosis: Secondary | ICD-10-CM | POA: Diagnosis not present

## 2019-12-25 NOTE — Telephone Encounter (Signed)
Pt notified per order of Dr. Marin Olp that "the swallowing test was normal.  No spasms, or blockage noted. [name] [name]"  Pt appreciative of call and has no questions or concerns at this time.

## 2019-12-25 NOTE — Telephone Encounter (Signed)
-----   Message from [name], MD sent at 12/25/2019  1:55 PM EDT ----- Call - the swallowing test was normal.  No spasms, or blockage noted.  Laurey Arrow

## 2020-04-10 ENCOUNTER — Encounter (HOSPITAL_BASED_OUTPATIENT_CLINIC_OR_DEPARTMENT_OTHER): Payer: Self-pay | Admitting: Emergency Medicine

## 2020-04-10 ENCOUNTER — Emergency Department (HOSPITAL_BASED_OUTPATIENT_CLINIC_OR_DEPARTMENT_OTHER): Payer: Medicare Other

## 2020-04-10 ENCOUNTER — Emergency Department (HOSPITAL_BASED_OUTPATIENT_CLINIC_OR_DEPARTMENT_OTHER)
Admission: EM | Admit: 2020-04-10 | Discharge: 2020-04-10 | Disposition: A | Discharge status: Home / Self Care | Payer: Medicare Other | Attending: Emergency Medicine | Admitting: Emergency Medicine

## 2020-04-10 ENCOUNTER — Other Ambulatory Visit: Payer: Self-pay

## 2020-04-10 DIAGNOSIS — Z20822 Contact with and (suspected) exposure to covid-19: Secondary | ICD-10-CM | POA: Insufficient documentation

## 2020-04-10 DIAGNOSIS — R509 Fever, unspecified: Secondary | ICD-10-CM | POA: Insufficient documentation

## 2020-04-10 DIAGNOSIS — J101 Influenza due to other identified influenza virus with other respiratory manifestations: Secondary | ICD-10-CM | POA: Diagnosis present

## 2020-04-10 LAB — CBC WITH DIFFERENTIAL/PLATELET
Abs Immature Granulocytes: 0.03 10*3/uL (ref 0.00–0.07)
Basophils Absolute: 0 10*3/uL (ref 0.0–0.1)
Basophils Relative: 1 %
Eosinophils Absolute: 0 10*3/uL (ref 0.0–0.5)
Eosinophils Relative: 0 %
HCT: 37.2 % (ref 36.0–46.0)
Hemoglobin: 12.2 g/dL (ref 12.0–15.0)
Immature Granulocytes: 1 %
Lymphocytes Relative: 18 %
Lymphs Abs: 0.9 10*3/uL (ref 0.7–4.0)
MCH: 29.2 pg (ref 26.0–34.0)
MCHC: 32.8 g/dL (ref 30.0–36.0)
MCV: 89 fL (ref 80.0–100.0)
Monocytes Absolute: 0.4 10*3/uL (ref 0.1–1.0)
Monocytes Relative: 8 %
Neutro Abs: 3.7 10*3/uL (ref 1.7–7.7)
Neutrophils Relative %: 72 %
Platelets: 273 10*3/uL (ref 150–400)
RBC: 4.18 MIL/uL (ref 3.87–5.11)
RDW: 11.7 % (ref 11.5–15.5)
WBC: 5.1 10*3/uL (ref 4.0–10.5)
nRBC: 0 % (ref 0.0–0.2)

## 2020-04-10 LAB — URINALYSIS, MICROSCOPIC (REFLEX)

## 2020-04-10 LAB — URINALYSIS, ROUTINE W REFLEX MICROSCOPIC
Bilirubin Urine: NEGATIVE
Glucose, UA: NEGATIVE mg/dL
Ketones, ur: NEGATIVE mg/dL
Nitrite: NEGATIVE
Protein, ur: 30 mg/dL — AB
Specific Gravity, Urine: 1.01 (ref 1.005–1.030)
pH: 6 (ref 5.0–8.0)

## 2020-04-10 LAB — COMPREHENSIVE METABOLIC PANEL
ALT: 27 U/L (ref 0–44)
AST: 24 U/L (ref 15–41)
Albumin: 3.7 g/dL (ref 3.5–5.0)
Alkaline Phosphatase: 50 U/L (ref 38–126)
Anion gap: 10 (ref 5–15)
BUN: 16 mg/dL (ref 6–20)
CO2: 22 mmol/L (ref 22–32)
Calcium: 8.7 mg/dL — ABNORMAL LOW (ref 8.9–10.3)
Chloride: 101 mmol/L (ref 98–111)
Creatinine, Ser: 0.88 mg/dL (ref 0.44–1.00)
GFR calc Af Amer: >60 mL/min (ref >60)
GFR calc non Af Amer: >60 mL/min (ref >60)
Glucose, Bld: 99 mg/dL (ref 70–99)
Potassium: 4.3 mmol/L (ref 3.5–5.1)
Sodium: 133 mmol/L — ABNORMAL LOW (ref 135–145)
Total Bilirubin: 0.5 mg/dL (ref 0.3–1.2)
Total Protein: 7.5 g/dL (ref 6.5–8.1)

## 2020-04-10 LAB — SARS CORONAVIRUS 2 BY RT PCR (HOSPITAL ORDER, PERFORMED IN ~~LOC~~ HOSPITAL LAB): SARS Coronavirus 2: NEGATIVE

## 2020-04-10 MED ORDER — ACETAMINOPHEN 325 MG PO TABS
650.0000 mg | ORAL_TABLET | Freq: Once | ORAL | Status: AC | PRN
  Administered 2020-04-10: 650 mg via ORAL
  Filled 2020-04-10: qty 2

## 2020-04-10 MED ORDER — SODIUM CHLORIDE 0.9 % IV BOLUS
1000.0000 mL | Freq: Once | INTRAVENOUS | Status: AC
  Administered 2020-04-10: 1000 mL via INTRAVENOUS

## 2020-04-10 NOTE — Discharge Instructions (Signed)
Please read and follow all provided instructions.  Your diagnoses today include:  1. Febrile illness    Tests performed today include:  Blood counts and electrolytes -normal  Kidney function test -normal  Covid PCR test -negative  Chest x-ray -no signs of pneumonia  Vital signs. See below for your results today.   Medications prescribed:   None  Take any prescribed medications only as directed.  Home care instructions:  Follow any educational materials contained in this packet.  BE VERY CAREFUL not to take multiple medicines containing Tylenol (also called acetaminophen). Doing so can lead to an overdose which can damage your liver and cause liver failure and possibly death.   Follow-up instructions: Please follow-up with your primary care provider in the next 3 days for further evaluation of your symptoms.   Return instructions:   Please return to the Emergency Department if you experience worsening symptoms.   Return with chest pain, worsening shortness of breath or trouble breathing, lightheadedness or syncope.  Please return if you have any other emergent concerns.  Additional Information:  Your vital signs today were: BP 107/73 (BP Location: Left Arm)   Pulse 60   Temp 98.2 F (36.8 C)   Resp 18   Ht 5\' 6"  (1.676 m)   Wt (!) 100.2 kg   SpO2 100%   BMI 35.67 kg/m  If your blood pressure (BP) was elevated above 135/85 this visit, please have this repeated by your doctor within one month. --------------

## 2020-04-10 NOTE — ED Triage Notes (Addendum)
Sore throat, fever, body aches for several days. Neg COVID and strep test at [facility]. She was given an antihistamine. Symptoms continue. She had ibuprofen at 930 today. She has had the 1st COVID vaccine.

## 2020-04-10 NOTE — ED Provider Notes (Signed)
[facility] EMERGENCY DEPARTMENT Provider Note   CSN: [id number] Arrival date & time: 04/10/20  1036     History Chief Complaint  Patient presents with  . Sore Throat    [name] [name] is a 49 y.o. female.  Patient with history of myelofibrosis, JRA on Plaquenil, followed by [facility] rheumatology -- presents to the emergency department with flulike symptoms ongoing over the past 9 days.  Patient developed nasal congestion and sore throat at onset.  She thought these were to seasonal allergies at first.  5 days ago, she was not feeling better, so she went to [facility].  There she had a negative strep test and negative rapid Covid test.  She was discharged with a nasal steroid.  3 days ago she went to a different fast med where she again had a negative rapid Covid test.  She was told to maintain good hydration.  She spoke with her rheumatologist yesterday who did not feel that her symptoms were related to myelofibrosis.  She continued to have intermittent fevers, to 103 F this morning, so she went back to fast med.  She was diagnosed with suspected sinusitis and given a prescription for doxycycline which she has not started yet.  She decided to come here for further evaluation given that she felt progressively dizzy and off-balance.  She denies any sinus pressure, nasal congestion at the current time its not feel that she has sinusitis.  She does report a cough when she breathes in deep and chest congestion.  Patient denies signs of stroke including: facial droop, slurred speech, aphasia, weakness/numbness in extremities, imbalance/trouble walking.  Patient states that she has received the first Covid vaccine and is due to get her second dose in 1 week.         Past Medical History:  Diagnosis Date  . Anemia   . Arthritis    Myleofibrosis- bone marrow deteriorating  . Blood transfusion without reported diagnosis   . Myelofibrosis ([place])   . Pericarditis 2004-2007   Reoccuring:  without flare since 2007  . Shortness of breath dyspnea    on exertion when having flare up of mylofibrosis or iron deficient    Patient Active Problem List   Diagnosis Date Noted  . Abnormal uterine bleeding (AUB) 04/20/2015  . Myelofibrosis ([place]) 08/13/2013  . Lipoma of axilla 08/12/2013  . Anemia, iron deficiency 07/11/2011    Past Surgical History:  Procedure Laterality Date  . BREAST EXCISIONAL BIOPSY Right 2017  . CESAREAN SECTION     x1  . DILITATION & CURRETTAGE/HYSTROSCOPY WITH NOVASURE ABLATION N/A 04/20/2015   Procedure: DILATATION & CURETTAGE/HYSTEROSCOPY WITH [place] ABLATION/RESECTION FIBROID WITH MYOSURE;  Surgeon: Lavonia Drafts, MD;  [facility];  Service: Gynecology;  Laterality: N/A;  . GREAT TOE ARTHRODESIS, INTERPHALANGEAL JOINT  2011   left  . LIPOMA EXCISION Right 06/26/2014   Procedure: EXCISION RIGHT AXILLA  LIPOMA;  Surgeon: Doreen Salvage, MD;  Location: [facility];  Service: General;  Laterality: Right;  . PORT A CATH REVISION     insertion-[facility]  . PORT-A-CATH REMOVAL Right 06/26/2014   Procedure: REMOVAL PORT-A-CATH;  Surgeon: Doreen Salvage, MD;  Location: [facility];  Service: General;  Laterality: Right;     OB History    Gravida  3   Para  2   Term  2   Preterm      AB  1   Living  2     SAB  1  TAB      Ectopic      Multiple      Live Births              Family History  Problem Relation Age of Onset  . Diabetes Mother   . Varicose Veins Mother   . Diabetes Paternal Uncle   . Hypertension Paternal Uncle   . Cancer Maternal Grandmother   . Breast cancer Maternal Grandmother 62    Social History   Tobacco Use  . Smoking status: Never Smoker  . Smokeless tobacco: Never Used  . Tobacco comment: never used tobacco  Substance Use Topics  . Alcohol use: No    Alcohol/week: 0.0 standard drinks  . Drug use: No    Home Medications Prior to Admission medications   Medication Sig  Start Date End Date Taking? Authorizing Provider  Acetaminophen-Guaifenesin 650-400 MG TABS Take by mouth. 07/26/09   [provider]  diclofenac Sodium (VOLTAREN) 1 % GEL Apply topically. 06/18/19 06/17/20  [provider]  folic acid (FOLVITE) 1 MG tablet Take 1 mg by mouth daily.    [provider]  hydroxychloroquine (PLAQUENIL) 200 MG tablet Take by mouth. 09/23/19 06/19/20  [provider]  ketoconazole (NIZORAL) 2 % cream Apply topically. 02/17/19   [provider]  multivitamin-iron-minerals-folic acid (THERAPEUTIC-M) TABS tablet Take 1 tablet by mouth daily.    [provider]  naproxen (NAPROSYN) 500 MG tablet Take 1 tablet (500 mg total) by mouth 2 (two) times daily with a meal. As needed for pain 01/31/18   Lavonia Drafts, MD  oxyCODONE (OXY IR/ROXICODONE) 5 MG immediate release tablet Take by mouth every 6 (six) hours as needed.  06/25/14   [provider]  predniSONE (DELTASONE) 5 MG tablet 30mg  (6 pills) daily x3 days, 15mg  (3 pills) daily x1 week, 10mg  (2 pills) daily x1 week, 5mg  (1 pill) daily x1 week 01/03/19   [provider]  Vitamin D, Ergocalciferol, (DRISDOL) 50000 units CAPS capsule Take 50,000 Units by mouth every 7 (seven) days.    [provider]    Allergies    Aspirin, Coconut oil, and Coconut fatty acids  Review of Systems   Review of Systems  Constitutional: Positive for chills, fatigue and fever.  HENT: Negative for congestion, ear pain, rhinorrhea, sinus pressure and sore throat.   Eyes: Negative for redness.  Respiratory: Positive for cough and chest tightness. Negative for shortness of breath and wheezing.   Gastrointestinal: Positive for diarrhea. Negative for abdominal pain, nausea and vomiting.  Genitourinary: Negative for dysuria.  Musculoskeletal: Positive for myalgias. Negative for neck stiffness.  Skin: Negative for rash.  Neurological: Positive for dizziness.  Negative for headaches.  Hematological: Negative for adenopathy.    Physical Exam Updated Vital Signs BP 96/65 (BP Location: Right Arm)   Pulse 90   Temp 98.7 F (37.1 C) (Oral)   Resp 16   Ht 5\' 6"  (1.676 m)   Wt (!) 100.2 kg   SpO2 99%   BMI 35.67 kg/m   Physical Exam Vitals and nursing note reviewed.  Constitutional:      Appearance: She is well-developed.  HENT:     Head: Normocephalic and atraumatic.     Jaw: No trismus.     Right Ear: Tympanic membrane, ear canal and external ear normal.     Left Ear: Tympanic membrane, ear canal and external ear normal.     Nose: Congestion present. No mucosal edema or rhinorrhea.  Mouth/Throat:     Mouth: Mucous membranes are moist. Mucous membranes are not dry. No oral lesions.     Pharynx: Uvula midline. No oropharyngeal exudate, posterior oropharyngeal erythema or uvula swelling.     Tonsils: No tonsillar abscesses.  Eyes:     General:        Right eye: No discharge.        Left eye: No discharge.     Conjunctiva/sclera: Conjunctivae normal.  Cardiovascular:     Rate and Rhythm: Normal rate and regular rhythm.     Heart sounds: Normal heart sounds.  Pulmonary:     Effort: Pulmonary effort is normal. No respiratory distress.     Breath sounds: Normal breath sounds. No wheezing or rales.  Abdominal:     Palpations: Abdomen is soft.     Tenderness: There is no abdominal tenderness.  Musculoskeletal:     Cervical back: Normal range of motion and neck supple.  Lymphadenopathy:     Cervical: No cervical adenopathy.  Skin:    General: Skin is warm and dry.  Neurological:     Mental Status: She is alert.     ED Results / Procedures / Treatments   Labs (all labs ordered are listed, but only abnormal results are displayed) Labs Reviewed  COMPREHENSIVE METABOLIC PANEL - Abnormal; Notable for the following components:      Result Value   Sodium 133 (*)    Calcium 8.7 (*)    All other components within normal limits   URINALYSIS, ROUTINE W REFLEX MICROSCOPIC - Abnormal; Notable for the following components:   Hgb urine dipstick LARGE (*)    Protein, ur 30 (*)    Leukocytes,Ua TRACE (*)    All other components within normal limits  URINALYSIS, MICROSCOPIC (REFLEX) - Abnormal; Notable for the following components:   Bacteria, UA MANY (*)    All other components within normal limits  SARS CORONAVIRUS 2 BY RT PCR (HOSPITAL ORDER, [place] [facility])  CBC WITH DIFFERENTIAL/PLATELET    EKG None  Radiology DG Chest Port 1 View  Result Date: 04/10/2020 CLINICAL DATA:  Acute onset of sore throat, fever and myalgias that began several days ago. Negative COVID-19 and rapid strep test at urgent care. EXAM: PORTABLE CHEST 1 VIEW COMPARISON:  CT chest 12/11/2019. Chest x-rays 12/02/2019 and earlier. FINDINGS: Cardiac silhouette and mediastinal contours normal in appearance for the AP portable technique. Pulmonary parenchyma clear. Bronchovascular markings normal. Pulmonary vascularity normal. No pneumothorax. No visible pleural effusions. IMPRESSION: No acute cardiopulmonary disease. Electronically Signed   By: Evangeline Dakin M.D.   On: 04/10/2020 14:24    Procedures Procedures (including critical care time)  Medications Ordered in ED Medications  acetaminophen (TYLENOL) tablet 650 mg (650 mg Oral Given 04/10/20 1048)  sodium chloride 0.9 % bolus 1,000 mL ( Intravenous Stopped 04/10/20 1429)    ED Course  I have reviewed the triage vital signs and the nursing notes.  Pertinent labs & imaging results that were available during my care of the patient were reviewed by me and considered in my medical decision making (see chart for details).  Patient seen and examined. Work-up initiated. Fluids ordered.   Vital signs reviewed and are as follows: BP 96/65 (BP Location: Right Arm)   Pulse 90   Temp 98.7 F (37.1 C) (Oral)   Resp 16   Ht 5\' 6"  (1.676 m)   Wt (!) 100.2 kg   SpO2 99%    BMI 35.67  kg/m   Chest x-ray negative.  Labs are reassuring.  Fevers improved.  Patient received IV hydration.  Awaiting UA as well as orthostatic vital signs.  Patient updated on results to this point.  Discussed continued symptomatic treatment and PCP follow-up.  4:53 PM UA without compelling signs of infection.  Orthostatics look okay.  Plan for discharged home.  Patient urged to return with worsening symptoms or other concerns. Patient verbalized understanding and agrees with plan.      MDM Rules/Calculators/A&P                          Patient with history of autoimmune disease, on Plaquenil presents with febrile illness over the past 9 days.  Work-up today largely reassuring.  CBC and CMP are unremarkable.  Chest x-ray without pneumonia.  Covid PCR negative.  UA does not show infection.  Patient was treated with IV fluids with improvement.  Fever still resolved.  Patient looks well, nontoxic.  Encouraged to call PCP on Monday for follow-up appointment.   Final Clinical Impression(s) / ED Diagnoses Final diagnoses:  Febrile illness    Rx / DC Orders ED Discharge Orders    None       Carlisle Cater, PA-C 04/10/20 1703    Charlesetta Shanks, MD 04/14/20 [id number]

## 2020-04-10 NOTE — ED Notes (Addendum)
Pt states she is concerned because she is sweating and dizzy. Pt brought in to triage for reeval.

## 2020-09-07 ENCOUNTER — Other Ambulatory Visit: Payer: Self-pay | Admitting: Family Medicine

## 2020-09-07 DIAGNOSIS — Z1231 Encounter for screening mammogram for malignant neoplasm of breast: Secondary | ICD-10-CM

## 2020-09-09 ENCOUNTER — Ambulatory Visit
Admission: RE | Admit: 2020-09-09 | Discharge: 2020-09-09 | Disposition: A | Discharge status: Home / Self Care | Payer: Medicare Other | Source: Ambulatory Visit | Attending: Family Medicine | Admitting: Family Medicine

## 2020-09-09 ENCOUNTER — Other Ambulatory Visit: Payer: Self-pay

## 2020-09-09 DIAGNOSIS — Z1231 Encounter for screening mammogram for malignant neoplasm of breast: Secondary | ICD-10-CM

## 2020-10-15 ENCOUNTER — Encounter: Payer: Self-pay | Admitting: Obstetrics & Gynecology

## 2020-10-15 ENCOUNTER — Other Ambulatory Visit (HOSPITAL_COMMUNITY)
Admission: RE | Admit: 2020-10-15 | Discharge: 2020-10-15 | Disposition: A | Discharge status: Home / Self Care | Payer: Medicare Other | Source: Ambulatory Visit | Attending: Obstetrics & Gynecology | Admitting: Obstetrics & Gynecology

## 2020-10-15 ENCOUNTER — Other Ambulatory Visit: Payer: Self-pay

## 2020-10-15 ENCOUNTER — Ambulatory Visit (INDEPENDENT_AMBULATORY_CARE_PROVIDER_SITE_OTHER): Payer: Medicare Other | Admitting: Obstetrics & Gynecology

## 2020-10-15 VITALS — BP 120/85 | HR 70 | Ht 66.0 in | Wt 222.0 lb

## 2020-10-15 DIAGNOSIS — N951 Menopausal and female climacteric states: Secondary | ICD-10-CM | POA: Diagnosis not present

## 2020-10-15 DIAGNOSIS — Z01419 Encounter for gynecological examination (general) (routine) without abnormal findings: Secondary | ICD-10-CM | POA: Diagnosis present

## 2020-10-15 DIAGNOSIS — Z124 Encounter for screening for malignant neoplasm of cervix: Secondary | ICD-10-CM

## 2020-10-15 MED ORDER — GABAPENTIN 600 MG PO TABS: 600.0000 mg | ORAL_TABLET | Freq: Every day | ORAL | 3 refills | Status: AC

## 2020-10-15 NOTE — Progress Notes (Signed)
GYNECOLOGY ANNUAL PREVENTATIVE CARE ENCOUNTER NOTE  History:     [name] [name] is a 50 y.o. G42P2012 female with history of endometrial ablation here for a routine annual gynecologic exam.  Current complaints: hot flashes, night sweats, and brain fog. Wants to be tested for menopause; in addition to preventative healthcare maintenance labs.   Denies abnormal vaginal bleeding, discharge, pelvic pain, problems with intercourse or other gynecologic concerns.    Gynecologic History No LMP recorded. (Menstrual status: Other). Last Pap: 01/31/2018. Results were: normal with negative HPV Last mammogram: 09/10/2020. Results were: normal  Obstetric History OB History  Gravida Para Term Preterm AB Living  3 2 2   1 2   SAB IAB Ectopic Multiple Live Births  1            # Outcome Date GA Lbr Len/2nd Weight Sex Delivery Anes PTL Lv  3 SAB           2 Term           1 Term             Past Medical History:  Diagnosis Date  . Anemia   . Arthritis    Myleofibrosis- bone marrow deteriorating  . Blood transfusion without reported diagnosis   . Myelofibrosis ([place])   . Pericarditis 2004-2007   Reoccuring: without flare since 2007  . Shortness of breath dyspnea    on exertion when having flare up of mylofibrosis or iron deficient    Past Surgical History:  Procedure Laterality Date  . BREAST EXCISIONAL BIOPSY Right 2017  . CESAREAN SECTION     x1  . DILITATION & CURRETTAGE/HYSTROSCOPY WITH NOVASURE ABLATION N/A 04/20/2015   Procedure: DILATATION & CURETTAGE/HYSTEROSCOPY WITH [place] ABLATION/RESECTION FIBROID WITH MYOSURE;  Surgeon: Lavonia Drafts, MD;  Location: [facility];  Service: Gynecology;  Laterality: N/A;  . GREAT TOE ARTHRODESIS, INTERPHALANGEAL JOINT  2011   left  . LIPOMA EXCISION Right 06/26/2014   Procedure: EXCISION RIGHT AXILLA  LIPOMA;  Surgeon: Doreen Salvage, MD;  Location: [facility];  Service: General;  Laterality: Right;  . PORT A CATH REVISION      insertion-[facility]  . PORT-A-CATH REMOVAL Right 06/26/2014   Procedure: REMOVAL PORT-A-CATH;  Surgeon: Doreen Salvage, MD;  Location: [facility];  Service: General;  Laterality: Right;    Current Outpatient Medications on File Prior to Visit  Medication Sig Dispense Refill  . Acetaminophen-Guaifenesin 650-400 MG TABS Take by mouth.    . folic acid (FOLVITE) 1 MG tablet Take 1 mg by mouth daily.    Marland Kitchen ketoconazole (NIZORAL) 2 % cream Apply topically.    . multivitamin-iron-minerals-folic acid (THERAPEUTIC-M) TABS tablet Take 1 tablet by mouth daily.    . naproxen (NAPROSYN) 500 MG tablet Take 1 tablet (500 mg total) by mouth 2 (two) times daily with a meal. As needed for pain 60 tablet 2  . Vitamin D, Ergocalciferol, (DRISDOL) 50000 units CAPS capsule Take 50,000 Units by mouth every 7 (seven) days.     No current facility-administered medications on file prior to visit.    Allergies  Allergen Reactions  . Aspirin Itching and Shortness Of Breath  . Coconut Oil Itching and Other (See Comments)    Sores in mouth Sores in mouth   . Coconut Fatty Acids     Coconut in raw form    Social History:  reports that she has never smoked. She has never used smokeless tobacco. She reports that she  does not drink alcohol and does not use drugs.  Family History  Problem Relation Age of Onset  . Diabetes Mother   . Varicose Veins Mother   . Diabetes Paternal Uncle   . Hypertension Paternal Uncle   . Cancer Maternal Grandmother   . Breast cancer Maternal Grandmother 23    The following portions of the patient's history were reviewed and updated as appropriate: allergies, current medications, past family history, past medical history, past social history, past surgical history and problem list.  Review of Systems Pertinent items noted in HPI and remainder of comprehensive ROS otherwise negative.  Physical Exam:  BP 120/85   Pulse 70   Ht 5\' 6"  (1.676 m)   Wt 222 lb (100.7 kg)    BMI 35.83 kg/m  CONSTITUTIONAL: Well-developed, well-nourished female in no acute distress.  HENT:  Normocephalic, atraumatic, External right and left ear normal.  EYES: Conjunctivae and EOM are normal. Pupils are equal, round, and reactive to light. No scleral icterus.  NECK: Normal range of motion, supple, no masses.  Normal thyroid.  SKIN: Skin is warm and dry. No rash noted. Not diaphoretic. No erythema. No pallor. MUSCULOSKELETAL: Normal range of motion. No tenderness.  No cyanosis, clubbing, or edema.   NEUROLOGIC: Alert and oriented to person, place, and time. Normal reflexes, muscle tone coordination.  PSYCHIATRIC: Normal mood and affect. Normal behavior. Normal judgment and thought content. CARDIOVASCULAR: Normal heart rate noted, regular rhythm RESPIRATORY: Clear to auscultation bilaterally. Effort and breath sounds normal, no problems with respiration noted. BREASTS: Symmetric in size. No masses, tenderness, skin changes, nipple drainage, or lymphadenopathy bilaterally. Performed in the presence of a chaperone. ABDOMEN: Soft, no distention noted.  No tenderness, rebound or guarding.  PELVIC: Normal appearing external genitalia and urethral meatus; normal appearing vaginal mucosa and cervix.  No abnormal discharge noted.  Pap smear obtained.  Normal uterine size, no other palpable masses, no uterine or adnexal tenderness.  Performed in the presence of a chaperone.   Assessment and Plan:    1. Menopausal vasomotor syndrome Patient with menopausal vasomotor symptoms. Discussed lifestyle interventions such as wearing light clothing, remaining in cool environments, having fan/air conditioner in the room, avoiding hot beverages etc.  Discussed using hormone therapy and concerns about increased risk of heart disease, cerebrovascular disease, thromboembolic disease, and breast cancer.  Also discussed other medical options such as Paxil, Effexor, or Neurontin.   Also discussed alternative  therapies such as herbal remedies but cautioned that most of the products contained phytoestrogens (plant estrogens) in unregulated amounts which can have the same effects on the body as the pharmaceutical estrogen preparations.  Also referred her to www.menopause.org for other alternative options.  She desires Neurontin for now, will monitor response.  Nitro checked, patient aware that this may not be elevated unless she is menopausal (perimenopause can also cause vasomotor symptoms and FSH may not be elevated). - Follicle stimulating hormone - gabapentin (NEURONTIN) 600 MG tablet; Take 1 tablet (600 mg total) by mouth at bedtime.  Dispense: 30 tablet; Refill: 3  2. Well woman exam with routine gynecological exam - Cytology - PAP - CBC - Comprehensive metabolic panel - Lipid panel Will follow up results of pap smear and labs and manage accordingly. Mammogram is up to date. Routine preventative health maintenance measures emphasized. Please refer to After Visit Summary for other counseling recommendations.      Verita Schneiders, MD, [place] for [employer], [facility]

## 2020-10-15 NOTE — Patient Instructions (Addendum)
Patient with bothersome menopausal vasomotor symptoms. Discussed lifestyle interventions such as wearing light clothing, remaining in cool environments, having fan/air conditioner in the room, avoiding hot beverages etc.  Discussed using hormone therapy and concerns about increased risk of heart disease, cerebrovascular disease, thromboembolic disease, and breast cancer.  Also discussed other medical options such as Paxil, Effexor, or Neurontin.   Also discussed alternative therapies such as herbal remedies but cautioned that most of the products contained phytoestrogens (plant estrogens) in unregulated amounts which can have the same effects on the body as the pharmaceutical estrogen preparations.  Also referred her to www.menopause.org for other alternative options.   Menopause Menopause is the normal time of a woman's life when menstrual periods stop completely. It marks the natural end to a woman's ability to become pregnant. It can be defined as the absence of a menstrual period for 12 months without another medical cause. The transition to menopause (perimenopause) most often happens between the ages of 53 and 103, and can last for many years. During perimenopause, hormone levels change in your body, which can cause symptoms and affect your health. Menopause may increase your risk for:  Weakened bones (osteoporosis), which causes fractures.  Depression.  Hardening and narrowing of the arteries (atherosclerosis), which can cause heart attacks and strokes. What are the causes? This condition is usually caused by a natural change in hormone levels that happens as you get older. The condition may also be caused by changes that are not natural, including:  Surgery to remove both ovaries (surgical menopause).  Side effects from some medicines, such as chemotherapy used to treat cancer (chemical menopause). What increases the risk? This condition is more likely to start at an earlier age if you have  certain medical conditions or have undergone treatments, including:  A tumor of the pituitary gland in the brain.  A disease that affects the ovaries and hormones.  Certain cancer treatments, such as chemotherapy or hormone therapy, or radiation therapy on the pelvis.  Heavy smoking and excessive alcohol use.  Family history of early menopause. This condition is also more likely to develop earlier in women who are very thin. What are the signs or symptoms? Symptoms of this condition include:  Hot flashes.  Irregular menstrual periods.  Night sweats.  Changes in feelings about sex. This could be a decrease in sex drive or an increased discomfort around your sexuality.  Vaginal dryness and thinning of the vaginal walls. This may cause painful sex.  Dryness of the skin and development of wrinkles.  Headaches.  Problems sleeping (insomnia).  Mood swings or irritability.  Memory problems.  Weight gain.  Hair growth on the face and chest.  Bladder infections or problems with urinating. How is this diagnosed? This condition is diagnosed based on your medical history, a physical exam, your age, your menstrual history, and your symptoms. Hormone tests may also be done. How is this treated? In some cases, no treatment is needed. You and your health care provider should make a decision together about whether treatment is necessary. Treatment will be based on your individual condition and preferences. Treatment for this condition focuses on managing symptoms. Treatment may include:  Menopausal hormone therapy (MHT).  Medicines to treat specific symptoms or complications.  Acupuncture.  Vitamin or herbal supplements. Before starting treatment, make sure to let your health care provider know if you have a personal or family history of these conditions:  Heart disease.  Breast cancer.  Blood clots.  Diabetes.  Osteoporosis. Follow these instructions at  home: Lifestyle  Do not use any products that contain nicotine or tobacco, such as cigarettes, e-cigarettes, and chewing tobacco. If you need help quitting, ask your health care provider.  Get at least 30 minutes of physical activity on 5 or more days each week.  Avoid alcoholic and caffeinated beverages, as well as spicy foods. This may help prevent hot flashes.  Get 7-8 hours of sleep each night.  If you have hot flashes, try: ? Dressing in layers. ? Avoiding things that may trigger hot flashes, such as spicy food, warm places, or stress. ? Taking slow, deep breaths when a hot flash starts. ? Keeping a fan in your home and office.  Find ways to manage stress, such as deep breathing, meditation, or journaling.  Consider going to group therapy with other women who are having menopause symptoms. Ask your health care provider about recommended group therapy meetings. Eating and drinking  Eat a healthy, balanced diet that contains whole grains, lean protein, low-fat dairy, and plenty of fruits and vegetables.  Your health care provider may recommend adding more soy to your diet. Foods that contain soy include tofu, tempeh, and soy milk.  Eat plenty of foods that contain calcium and vitamin D for bone health. Items that are rich in calcium include low-fat milk, yogurt, beans, almonds, sardines, broccoli, and kale.   Medicines  Take over-the-counter and prescription medicines only as told by your health care provider.  Talk with your health care provider before starting any herbal supplements. If prescribed, take vitamins and supplements as told by your health care provider. General instructions  Keep track of your menstrual periods, including: ? When they occur. ? How heavy they are and how long they last. ? How much time passes between periods.  Keep track of your symptoms, noting when they start, how often you have them, and how long they last.  Use vaginal lubricants or  moisturizers to help with vaginal dryness and improve comfort during sex.  Keep all follow-up visits. This is important. This includes any group therapy or counseling.   Contact a health care provider if:  You are still having menstrual periods after age 46.  You have pain during sex.  You have not had a period for 12 months and you develop vaginal bleeding. Get help right away if you have:  Severe depression.  Excessive vaginal bleeding.  Pain when you urinate.  A fast or irregular heartbeat (palpitations).  Severe headaches.  Abdominal pain or severe indigestion. Summary  Menopause is a normal time of life when menstrual periods stop completely. It is usually defined as the absence of a menstrual period for 12 months without another medical cause.  The transition to menopause (perimenopause) most often happens between the ages of 35 and 61 and can last for several years.  Symptoms can be managed through medicines, lifestyle changes, and complementary therapies such as acupuncture.  Eat a balanced diet that is rich in nutrients to promote bone health and heart health and to manage symptoms during menopause. This information is not intended to replace advice given to you by your health care provider. Make sure you discuss any questions you have with your health care provider. Document Revised: 05/28/2020 Document Reviewed: 02/12/2020 Elsevier Patient Education  2021 [employer]   Preventive Care 56-79 Years Old, Female Preventive care refers to lifestyle choices and visits with your health care provider that can promote health and wellness. This includes:  A  yearly physical exam. This is also called an annual wellness visit.  Regular dental and eye exams.  Immunizations.  Screening for certain conditions.  Healthy lifestyle choices, such as: ? Eating a healthy diet. ? Getting regular exercise. ? Not using drugs or products that contain nicotine and  tobacco. ? Limiting alcohol use. What can I expect for my preventive care visit? Physical exam Your health care provider will check your:  Height and weight. These may be used to calculate your BMI (body mass index). BMI is a measurement that tells if you are at a healthy weight.  Heart rate and blood pressure.  Body temperature.  Skin for abnormal spots. Counseling Your health care provider may ask you questions about your:  Past medical problems.  Family's medical history.  Alcohol, tobacco, and drug use.  Emotional well-being.  Home life and relationship well-being.  Sexual activity.  Diet, exercise, and sleep habits.  Work and work Statistician.  Access to firearms.  Method of birth control.  Menstrual cycle.  Pregnancy history. What immunizations do I need? Vaccines are usually given at various ages, according to a schedule. Your health care provider will recommend vaccines for you based on your age, medical history, and lifestyle or other factors, such as travel or where you work.   What tests do I need? Blood tests  Lipid and cholesterol levels. These may be checked every 5 years, or more often if you are over 40 years old.  Hepatitis C test.  Hepatitis B test. Screening  Lung cancer screening. You may have this screening every year starting at age 68 if you have a 30-pack-year history of smoking and currently smoke or have quit within the past 15 years.  Colorectal cancer screening. ? All adults should have this screening starting at age 20 and continuing until age 67. ? Your health care provider may recommend screening at age 52 if you are at increased risk. ? You will have tests every 1-10 years, depending on your results and the type of screening test.  Diabetes screening. ? This is done by checking your blood sugar (glucose) after you have not eaten for a while (fasting). ? You may have this done every 1-3 years.  Mammogram. ? This may be done  every 1-2 years. ? Talk with your health care provider about when you should start having regular mammograms. This may depend on whether you have a family history of breast cancer.  BRCA-related cancer screening. This may be done if you have a family history of breast, ovarian, tubal, or peritoneal cancers.  Pelvic exam and Pap test. ? This may be done every 3 years starting at age 23. ? Starting at age 60, this may be done every 5 years if you have a Pap test in combination with an HPV test. Other tests  STD (sexually transmitted disease) testing, if you are at risk.  Bone density scan. This is done to screen for osteoporosis. You may have this scan if you are at high risk for osteoporosis. Talk with your health care provider about your test results, treatment options, and if necessary, the need for more tests. Follow these instructions at home: Eating and drinking  Eat a diet that includes fresh fruits and vegetables, whole grains, lean protein, and low-fat dairy products.  Take vitamin and mineral supplements as recommended by your health care provider.  Do not drink alcohol if: ? Your health care provider tells you not to drink. ? You are pregnant, may  be pregnant, or are planning to become pregnant.  If you drink alcohol: ? Limit how much you have to 0-1 drink a day. ? Be aware of how much alcohol is in your drink. In the [place] one drink equals one 12 oz bottle of beer (355 mL), one 5 oz glass of wine (148 mL), or one 1 oz glass of hard liquor (44 mL).   Lifestyle  Take daily care of your teeth and gums. Brush your teeth every morning and night with fluoride toothpaste. Floss one time each day.  Stay active. Exercise for at least 30 minutes 5 or more days each week.  Do not use any products that contain nicotine or tobacco, such as cigarettes, e-cigarettes, and chewing tobacco. If you need help quitting, ask your health care provider.  Do not use drugs.  If you are  sexually active, practice safe sex. Use a condom or other form of protection to prevent STIs (sexually transmitted infections).  If you do not wish to become pregnant, use a form of birth control. If you plan to become pregnant, see your health care provider for a prepregnancy visit.  If told by your health care provider, take low-dose aspirin daily starting at age 30.  Find healthy ways to cope with stress, such as: ? Meditation, yoga, or listening to music. ? Journaling. ? Talking to a trusted person. ? Spending time with friends and family. Safety  Always wear your seat belt while driving or riding in a vehicle.  Do not drive: ? If you have been drinking alcohol. Do not ride with someone who has been drinking. ? When you are tired or distracted. ? While texting.  Wear a helmet and other protective equipment during sports activities.  If you have firearms in your house, make sure you follow all gun safety procedures. What's next?  Visit your health care provider once a year for an annual wellness visit.  Ask your health care provider how often you should have your eyes and teeth checked.  Stay up to date on all vaccines. This information is not intended to replace advice given to you by your health care provider. Make sure you discuss any questions you have with your health care provider. Document Revised: 06/01/2020 Document Reviewed: 05/09/2018 Elsevier Patient Education  2021 [employer].

## 2020-10-16 LAB — CBC
Hematocrit: 35.9 % (ref 34.0–46.6)
Hemoglobin: 11.7 g/dL (ref 11.1–15.9)
MCH: 28.9 pg (ref 26.6–33.0)
MCHC: 32.6 g/dL (ref 31.5–35.7)
MCV: 89 fL (ref 79–97)
Platelets: 324 10*3/uL (ref 150–450)
RBC: 4.05 x10E6/uL (ref 3.77–5.28)
RDW: 12 % (ref 11.7–15.4)
WBC: 3.5 10*3/uL (ref 3.4–10.8)

## 2020-10-16 LAB — COMPREHENSIVE METABOLIC PANEL
ALT: 11 IU/L (ref 0–32)
AST: 15 IU/L (ref 0–40)
Albumin/Globulin Ratio: 1.8 (ref 1.2–2.2)
Albumin: 4.2 g/dL (ref 3.8–4.8)
Alkaline Phosphatase: 60 IU/L (ref 44–121)
BUN/Creatinine Ratio: 18 (ref 9–23)
BUN: 14 mg/dL (ref 6–24)
Bilirubin Total: 0.3 mg/dL (ref 0.0–1.2)
CO2: 24 mmol/L (ref 20–29)
Calcium: 9.4 mg/dL (ref 8.7–10.2)
Chloride: 103 mmol/L (ref 96–106)
Creatinine, Ser: 0.76 mg/dL (ref 0.57–1.00)
GFR calc Af Amer: 107 mL/min/{1.73_m2} (ref >59)
GFR calc non Af Amer: 92 mL/min/{1.73_m2} (ref >59)
Globulin, Total: 2.3 g/dL (ref 1.5–4.5)
Glucose: 89 mg/dL (ref 65–99)
Potassium: 4.2 mmol/L (ref 3.5–5.2)
Sodium: 141 mmol/L (ref 134–144)
Total Protein: 6.5 g/dL (ref 6.0–8.5)

## 2020-10-16 LAB — FOLLICLE STIMULATING HORMONE: FSH: 64.9 m[IU]/mL

## 2020-10-18 ENCOUNTER — Telehealth: Payer: Self-pay

## 2020-10-18 NOTE — Telephone Encounter (Signed)
Called pt to discuss [facility] lab. Pt made aware that her [name] [name] was 64.9 and that is consistent with menopausaul state. Understanding was voiced. Muad Noga l Tacha Manni, CMA

## 2020-10-18 NOTE — Telephone Encounter (Signed)
-----   Message from Osborne Oman, MD sent at 10/16/2020  8:25 PM EST ----- This is consistent with menopausal state.  Please inform patient. Thank you!

## 2020-10-19 LAB — CYTOLOGY - PAP
Comment: NEGATIVE
Diagnosis: NEGATIVE
High risk HPV: NEGATIVE

## 2021-02-09 ENCOUNTER — Other Ambulatory Visit: Payer: Self-pay

## 2021-02-09 DIAGNOSIS — B379 Candidiasis, unspecified: Secondary | ICD-10-CM

## 2021-02-09 MED ORDER — FLUCONAZOLE 150 MG PO TABS: 150.0000 mg | ORAL_TABLET | Freq: Once | ORAL | 0 refills | Status: AC

## 2021-05-06 ENCOUNTER — Emergency Department (HOSPITAL_BASED_OUTPATIENT_CLINIC_OR_DEPARTMENT_OTHER): Payer: Self-pay

## 2021-05-06 ENCOUNTER — Other Ambulatory Visit: Payer: Self-pay

## 2021-05-06 ENCOUNTER — Other Ambulatory Visit (HOSPITAL_BASED_OUTPATIENT_CLINIC_OR_DEPARTMENT_OTHER): Payer: Self-pay

## 2021-05-06 ENCOUNTER — Emergency Department (HOSPITAL_BASED_OUTPATIENT_CLINIC_OR_DEPARTMENT_OTHER)
Admission: EM | Admit: 2021-05-06 | Discharge: 2021-05-06 | Disposition: A | Discharge status: Home / Self Care | Payer: Self-pay | Attending: Emergency Medicine | Admitting: Emergency Medicine

## 2021-05-06 ENCOUNTER — Encounter (HOSPITAL_BASED_OUTPATIENT_CLINIC_OR_DEPARTMENT_OTHER): Payer: Self-pay | Admitting: Emergency Medicine

## 2021-05-06 DIAGNOSIS — R1084 Generalized abdominal pain: Secondary | ICD-10-CM | POA: Insufficient documentation

## 2021-05-06 LAB — CBC WITH DIFFERENTIAL/PLATELET
Abs Immature Granulocytes: 0 10*3/uL (ref 0.00–0.07)
Basophils Absolute: 0 10*3/uL (ref 0.0–0.1)
Basophils Relative: 1 %
Eosinophils Absolute: 0.1 10*3/uL (ref 0.0–0.5)
Eosinophils Relative: 3 %
HCT: 36.6 % (ref 36.0–46.0)
Hemoglobin: 12 g/dL (ref 12.0–15.0)
Immature Granulocytes: 0 %
Lymphocytes Relative: 44 %
Lymphs Abs: 1.6 10*3/uL (ref 0.7–4.0)
MCH: 29.9 pg (ref 26.0–34.0)
MCHC: 32.8 g/dL (ref 30.0–36.0)
MCV: 91.3 fL (ref 80.0–100.0)
Monocytes Absolute: 0.3 10*3/uL (ref 0.1–1.0)
Monocytes Relative: 7 %
Neutro Abs: 1.6 10*3/uL — ABNORMAL LOW (ref 1.7–7.7)
Neutrophils Relative %: 45 %
Platelets: 286 10*3/uL (ref 150–400)
RBC: 4.01 MIL/uL (ref 3.87–5.11)
RDW: 12.4 % (ref 11.5–15.5)
WBC: 3.5 10*3/uL — ABNORMAL LOW (ref 4.0–10.5)
nRBC: 0 % (ref 0.0–0.2)

## 2021-05-06 LAB — URINALYSIS, ROUTINE W REFLEX MICROSCOPIC
Bilirubin Urine: NEGATIVE
Glucose, UA: NEGATIVE mg/dL
Hgb urine dipstick: NEGATIVE
Ketones, ur: NEGATIVE mg/dL
Leukocytes,Ua: NEGATIVE
Nitrite: NEGATIVE
Protein, ur: NEGATIVE mg/dL
Specific Gravity, Urine: 1.025 (ref 1.005–1.030)
pH: 6.5 (ref 5.0–8.0)

## 2021-05-06 LAB — COMPREHENSIVE METABOLIC PANEL
ALT: 13 U/L (ref 0–44)
AST: 13 U/L — ABNORMAL LOW (ref 15–41)
Albumin: 3.9 g/dL (ref 3.5–5.0)
Alkaline Phosphatase: 49 U/L (ref 38–126)
Anion gap: 5 (ref 5–15)
BUN: 12 mg/dL (ref 6–20)
CO2: 28 mmol/L (ref 22–32)
Calcium: 8.9 mg/dL (ref 8.9–10.3)
Chloride: 104 mmol/L (ref 98–111)
Creatinine, Ser: 0.74 mg/dL (ref 0.44–1.00)
GFR, Estimated: >60 mL/min (ref >60)
Glucose, Bld: 98 mg/dL (ref 70–99)
Potassium: 4.1 mmol/L (ref 3.5–5.1)
Sodium: 137 mmol/L (ref 135–145)
Total Bilirubin: 0.8 mg/dL (ref 0.3–1.2)
Total Protein: 6.9 g/dL (ref 6.5–8.1)

## 2021-05-06 LAB — LIPASE, BLOOD: Lipase: 23 U/L (ref 11–51)

## 2021-05-06 MED ORDER — FENTANYL CITRATE (PF) 100 MCG/2ML IJ SOLN
50.0000 ug | Freq: Once | INTRAMUSCULAR | Status: AC
  Administered 2021-05-06: 50 ug via INTRAVENOUS
  Filled 2021-05-06: qty 2

## 2021-05-06 MED ORDER — IOHEXOL 300 MG/ML  SOLN
75.0000 mL | Freq: Once | INTRAMUSCULAR | Status: AC | PRN
  Administered 2021-05-06: 75 mL via INTRAVENOUS

## 2021-05-06 MED ORDER — SODIUM CHLORIDE 0.9 % IV BOLUS
1000.0000 mL | Freq: Once | INTRAVENOUS | Status: AC
  Administered 2021-05-06: 1000 mL via INTRAVENOUS

## 2021-05-06 MED ORDER — ONDANSETRON HCL 4 MG PO TABS
4.0000 mg | ORAL_TABLET | Freq: Four times a day (QID) | ORAL | 0 refills | Status: DC
  Filled 2021-05-06: qty 12, 3d supply, fill #0

## 2021-05-06 MED ORDER — ONDANSETRON HCL 4 MG/2ML IJ SOLN
4.0000 mg | Freq: Once | INTRAMUSCULAR | Status: AC
  Administered 2021-05-06: 4 mg via INTRAVENOUS
  Filled 2021-05-06: qty 2

## 2021-05-06 NOTE — ED Notes (Signed)
Patient transported to CT 

## 2021-05-06 NOTE — ED Triage Notes (Signed)
Pt arrives pov with c/o lower abdominal pain and lower back pain, mid back pain x 2 days

## 2021-05-06 NOTE — ED Provider Notes (Signed)
[facility] Provider Note   CSN: [id number] Arrival date & time: 05/06/21  [id number]     History Chief Complaint  Patient presents with   Abdominal Pain    PEARLA [name] [name] is a 50 y.o. female.  The history is provided by the patient.  Abdominal Pain Pain location:  Generalized (lower back) Pain quality: aching   Pain severity:  Mild Onset quality:  Gradual Duration:  2 days Timing:  Constant Progression:  Unchanged Chronicity:  New Context: previous surgery   Relieved by:  Nothing Worsened by:  Nothing Associated symptoms: no anorexia, no belching, no chest pain, no chills, no constipation, no cough, no diarrhea, no dysuria, no fatigue, no fever, no hematuria, no nausea, no shortness of breath, no sore throat, no vaginal bleeding, no vaginal discharge and no vomiting   Risk factors comment:  Myelofibrosis     Past Medical History:  Diagnosis Date   Anemia    Arthritis    Myleofibrosis- bone marrow deteriorating   Blood transfusion without reported diagnosis    Myelofibrosis ([place])    Pericarditis 2004-2007   Reoccuring: without flare since 2007   Shortness of breath dyspnea    on exertion when having flare up of mylofibrosis or iron deficient    Patient Active Problem List   Diagnosis Date Noted   Abnormal uterine bleeding (AUB) 04/20/2015   Myelofibrosis ([place]) 08/13/2013   Lipoma of axilla 08/12/2013   Anemia, iron deficiency 07/11/2011    Past Surgical History:  Procedure Laterality Date   BREAST EXCISIONAL BIOPSY Right 2017   CESAREAN SECTION     x1   DILITATION & CURRETTAGE/HYSTROSCOPY WITH NOVASURE ABLATION N/A 04/20/2015   Procedure: [place] ABLATION/RESECTION FIBROID WITH MYOSURE;  Surgeon: Lavonia Drafts, MD;  Location: [facility];  Service: Gynecology;  Laterality: N/A;   GREAT TOE ARTHRODESIS, INTERPHALANGEAL JOINT  2011   left   LIPOMA EXCISION Right 06/26/2014    Procedure: EXCISION RIGHT AXILLA  LIPOMA;  Surgeon: Doreen Salvage, MD;  Location: [facility];  Service: General;  Laterality: Right;   PORT A CATH REVISION     insertion-[facility]   PORT-A-CATH REMOVAL Right 06/26/2014   Procedure: REMOVAL PORT-A-CATH;  Surgeon: Doreen Salvage, MD;  Location: [facility];  Service: General;  Laterality: Right;     OB History     Gravida  3   Para  2   Term  2   Preterm      AB  1   Living  2      SAB  1   IAB      Ectopic      Multiple      Live Births              Family History  Problem Relation Age of Onset   Diabetes Mother    Varicose Veins Mother    Diabetes Paternal Uncle    Hypertension Paternal Uncle    Cancer Maternal Grandmother    Breast cancer Maternal Grandmother 9    Social History   Tobacco Use   Smoking status: Never   Smokeless tobacco: Never   Tobacco comments:    never used tobacco  Vaping Use   Vaping Use: Never used  Substance Use Topics   Alcohol use: No    Alcohol/week: 0.0 standard drinks   Drug use: No    Home Medications Prior to Admission medications   Medication Sig Start Date  End Date Taking? Authorizing Provider  ondansetron (ZOFRAN) 4 MG tablet Take 1 tablet (4 mg total) by mouth every 6 (six) hours. 05/06/21  Yes Lennice Sites, DO  Acetaminophen-Guaifenesin 650-400 MG TABS Take by mouth. 07/26/09   [provider]  folic acid (FOLVITE) 1 MG tablet Take 1 mg by mouth daily.    [provider]  gabapentin (NEURONTIN) 600 MG tablet Take 1 tablet (600 mg total) by mouth at bedtime. 10/15/20   Anyanwu, Sallyanne Havers, MD  ketoconazole (NIZORAL) 2 % cream Apply topically. 02/17/19   [provider]  multivitamin-iron-minerals-folic acid (THERAPEUTIC-M) TABS tablet Take 1 tablet by mouth daily.    [provider]  naproxen (NAPROSYN) 500 MG tablet Take 1 tablet (500 mg total) by mouth 2 (two) times daily with a meal. As needed for pain 01/31/18    Lavonia Drafts, MD  Vitamin D, Ergocalciferol, (DRISDOL) 50000 units CAPS capsule Take 50,000 Units by mouth every 7 (seven) days.    [provider]    Allergies    Aspirin, Coconut oil, and Coconut fatty acids  Review of Systems   Review of Systems  Constitutional:  Negative for chills, fatigue and fever.  HENT:  Negative for ear pain and sore throat.   Eyes:  Negative for pain and visual disturbance.  Respiratory:  Positive for wheezing. Negative for cough and shortness of breath.   Cardiovascular:  Negative for chest pain and palpitations.  Gastrointestinal:  Positive for abdominal pain. Negative for abdominal distention, anal bleeding, anorexia, blood in stool, constipation, diarrhea, nausea, rectal pain and vomiting.  Genitourinary:  Negative for dysuria, hematuria, vaginal bleeding and vaginal discharge.  Musculoskeletal:  Negative for arthralgias and back pain.  Skin:  Negative for color change and rash.  Neurological:  Negative for seizures and syncope.  All other systems reviewed and are negative.  Physical Exam Updated Vital Signs  ED Triage Vitals  Enc Vitals Group     BP 05/06/21 0807 106/78     Pulse Rate 05/06/21 0807 66     Resp 05/06/21 0807 19     Temp 05/06/21 0807 98.7 F (37.1 C)     Temp Source 05/06/21 0807 Oral     SpO2 05/06/21 0807 100 %     Weight 05/06/21 0817 227 lb (103 kg)     Height 05/06/21 0817 '5\' 6"'$  (1.676 m)     Head Circumference --      Peak Flow --      Pain Score 05/06/21 0815 7     Pain Loc --      Pain Edu? --      Excl. in [place]? --      Physical Exam Vitals and nursing note reviewed.  Constitutional:      General: She is not in acute distress.    Appearance: She is well-developed.  HENT:     Head: Normocephalic and atraumatic.     Mouth/Throat:     Mouth: Mucous membranes are moist.  Eyes:     Extraocular Movements: Extraocular movements intact.     Conjunctiva/sclera: Conjunctivae normal.     Pupils:  Pupils are equal, round, and reactive to light.  Cardiovascular:     Rate and Rhythm: Normal rate and regular rhythm.     Heart sounds: Normal heart sounds. No murmur heard. Pulmonary:     Effort: Pulmonary effort is normal. No respiratory distress.     Breath sounds: Normal breath sounds.  Abdominal:  General: There is distension.     Palpations: Abdomen is soft.     Tenderness: There is generalized abdominal tenderness. There is no guarding or rebound.  Musculoskeletal:     Cervical back: Neck supple.  Skin:    General: Skin is warm and dry.  Neurological:     Mental Status: She is alert.    ED Results / Procedures / Treatments   Labs (all labs ordered are listed, but only abnormal results are displayed) Labs Reviewed  CBC WITH DIFFERENTIAL/PLATELET - Abnormal; Notable for the following components:      Result Value   WBC 3.5 (*)    Neutro Abs 1.6 (*)    All other components within normal limits  COMPREHENSIVE METABOLIC PANEL - Abnormal; Notable for the following components:   AST 13 (*)    All other components within normal limits  LIPASE, BLOOD  URINALYSIS, ROUTINE W REFLEX MICROSCOPIC    EKG EKG Interpretation  Date/Time:  Friday May 06 2021 08:21:48 EDT Ventricular Rate:  61 PR Interval:  154 QRS Duration: 92 QT Interval:  402 QTC Calculation: 405 R Axis:   115 Text Interpretation: Sinus or ectopic atrial rhythm Right axis deviation Confirmed by Lennice Sites ([id number]) on 05/06/2021 8:24:33 AM  Radiology CT ABDOMEN PELVIS W CONTRAST  Result Date: 05/06/2021 CLINICAL DATA:  Acute lower abdominal pain. EXAM: CT ABDOMEN AND PELVIS WITH CONTRAST TECHNIQUE: Multidetector CT imaging of the abdomen and pelvis was performed using the standard protocol following bolus administration of intravenous contrast. CONTRAST:  25m OMNIPAQUE IOHEXOL 300 MG/ML  SOLN COMPARISON:  May 11, 2017. FINDINGS: Lower chest: No acute abnormality. Hepatobiliary: No gallstones or  biliary dilatation is noted. Stable low-density is noted in posterior segment of right hepatic lobe consistent with cyst or benign hemangioma. Pancreas: Unremarkable. No pancreatic ductal dilatation or surrounding inflammatory changes. Spleen: Normal in size without focal abnormality. Adrenals/Urinary Tract: Adrenal glands appear normal. Left renal cyst is noted. No hydronephrosis or renal obstruction is noted. No renal or ureteral calculi are noted. Urinary bladder is unremarkable. Stomach/Bowel: Stomach is within normal limits. Appendix appears normal. No evidence of bowel wall thickening, distention, or inflammatory changes. Vascular/Lymphatic: No significant vascular findings are present. No enlarged abdominal or pelvic lymph nodes. Reproductive: Uterus and bilateral adnexa are unremarkable. Other: No abdominal wall hernia or abnormality. No abdominopelvic ascites. Musculoskeletal: No acute or significant osseous findings. IMPRESSION: No acute abnormality seen in the abdomen or pelvis. Electronically Signed   By: JMarijo ConceptionM.D.   On: 05/06/2021 10:02    Procedures Procedures   Medications Ordered in ED Medications  sodium chloride 0.9 % bolus 1,000 mL (0 mLs Intravenous Stopped 05/06/21 1023)  fentaNYL (SUBLIMAZE) injection 50 mcg (50 mcg Intravenous Given 05/06/21 0902)  ondansetron (ZOFRAN) injection 4 mg (4 mg Intravenous Given 05/06/21 0902)  iohexol (OMNIPAQUE) 300 MG/ML solution 75 mL (75 mLs Intravenous Contrast Given 05/06/21 0925)    ED Course  I have reviewed the triage vital signs and the nursing notes.  Pertinent labs & imaging results that were available during my care of the patient were reviewed by me and considered in my medical decision making (see chart for details).    MDM Rules/Calculators/A&P                           ASTAISHA VICARYis here for abdominal pain.  History of myelofibrosis on Remicade.  Normal vitals.  No fever.  Pain  for the last 2 days.  Pain mostly  in the lower abdomen and lower back but also in the right flank.  No history of major abdominal surgeries.  She is overall tender diffusely.  She does appear distended on exam.  Differential is wide including pancreatitis, cholecystitis, kidney stone, bowel obstruction, constipation, colitis.  EKG shows sinus rhythm.  No ischemic changes.  Not having any chest pain.  Doubt ACS.  Will give IV fluids, IV Zofran, IV fentanyl, CT scan abdomen pelvis with basic labs and reevaluate.  Denies any urinary symptoms we will check UTI.  Lab work unremarkable.  No significant anemia, electrolyte abnormality, kidney injury.  CT scan without any signs of acute process.  No bowel obstruction.  No UTI.  Possibly acid related pain or muscular pain.  Recommend Tylenol.  Discharged in ED in good condition.  Recommend follow-up with primary care doctor.  This chart was dictated using voice recognition software.  Despite best efforts to proofread,  errors can occur which can change the documentation meaning.   Final Clinical Impression(s) / ED Diagnoses Final diagnoses:  Generalized abdominal pain    Rx / DC Orders ED Discharge Orders          Ordered    ondansetron (ZOFRAN) 4 MG tablet  Every 6 hours        05/06/21 [place], Wade Sigala, DO 05/06/21 1045

## 2021-05-06 NOTE — ED Notes (Signed)
ED Provider at bedside. 

## 2021-05-06 NOTE — ED Notes (Signed)
Pt's daughter here for patient to transport home.

## 2021-05-06 NOTE — ED Notes (Signed)
Pt returned from CT °

## 2021-05-20 ENCOUNTER — Other Ambulatory Visit (HOSPITAL_BASED_OUTPATIENT_CLINIC_OR_DEPARTMENT_OTHER): Payer: Self-pay

## 2021-06-13 ENCOUNTER — Emergency Department (HOSPITAL_BASED_OUTPATIENT_CLINIC_OR_DEPARTMENT_OTHER): Payer: Self-pay

## 2021-06-13 ENCOUNTER — Other Ambulatory Visit: Payer: Self-pay

## 2021-06-13 ENCOUNTER — Emergency Department (HOSPITAL_BASED_OUTPATIENT_CLINIC_OR_DEPARTMENT_OTHER)
Admission: EM | Admit: 2021-06-13 | Discharge: 2021-06-13 | Disposition: A | Discharge status: Home / Self Care | Payer: Self-pay | Attending: Emergency Medicine | Admitting: Emergency Medicine

## 2021-06-13 DIAGNOSIS — K5900 Constipation, unspecified: Secondary | ICD-10-CM | POA: Insufficient documentation

## 2021-06-13 DIAGNOSIS — R110 Nausea: Secondary | ICD-10-CM | POA: Insufficient documentation

## 2021-06-13 MED ORDER — GOLYTELY 236 G PO SOLR: 4000.0000 mL | Freq: Once | ORAL | 0 refills | Status: AC

## 2021-06-13 NOTE — ED Triage Notes (Signed)
Pt c/o constipation for 5 days. Pt had small BM on Friday. Pt endorses nausea and ABD pain. Pt denies fever/chills or vomiting.

## 2021-06-13 NOTE — Discharge Instructions (Addendum)
You were evaluated in the Emergency Department and after careful evaluation, we did not find any emergent condition requiring admission or further testing in the hospital.  Your exam/testing today was overall reassuring.  CT scan did not show any emergencies or obstructions.  Recommend more aggressive bowel regimen at home.  Use the GoLytely medication as we discussed.  Please return to the Emergency Department if you experience any worsening of your condition.  Thank you for allowing [place] to be a part of your care.

## 2021-06-13 NOTE — ED Provider Notes (Signed)
[facility] Emergency Department Provider Note MRN:  [id number]  Arrival date & time: 06/13/21     Chief Complaint   Constipation   History of Present Illness   [name] [name] is a 50 y.o. year-old female with a history of myelofibrosis presenting to the ED with chief complaint of constipation.  Decreased bowel movements over the past 4 to 5 days.  Small bowel movement 2 days ago.  Some mild abdominal bloating and nausea.  Denies vomiting, no chest pain or shortness of breath, no fever.  Currently without any significant pain.  Symptoms are constant, mild, no exacerbating or alleviating factors.  Has not passed any gas for about 24 hours.  Review of Systems  A complete 10 system review of systems was obtained and all systems are negative except as noted in the HPI and PMH.   Patient's Health History    Past Medical History:  Diagnosis Date   Anemia    Arthritis    Myleofibrosis- bone marrow deteriorating   Blood transfusion without reported diagnosis    Myelofibrosis ([place])    Pericarditis 2004-2007   Reoccuring: without flare since 2007   Shortness of breath dyspnea    on exertion when having flare up of mylofibrosis or iron deficient    Past Surgical History:  Procedure Laterality Date   BREAST EXCISIONAL BIOPSY Right 2017   CESAREAN SECTION     x1   DILITATION & CURRETTAGE/HYSTROSCOPY WITH NOVASURE ABLATION N/A 04/20/2015   Procedure: [place];  Surgeon: Lavonia Drafts, MD;  Location: [facility];  Service: Gynecology;  Laterality: N/A;   GREAT TOE ARTHRODESIS, INTERPHALANGEAL JOINT  2011   left   LIPOMA EXCISION Right 06/26/2014   Procedure: EXCISION RIGHT AXILLA  LIPOMA;  Surgeon: Doreen Salvage, MD;  Location: [place];  Service: General;  Laterality: Right;   PORT A CATH REVISION     insertion-[facility]   PORT-A-CATH REMOVAL Right 06/26/2014    Procedure: REMOVAL PORT-A-CATH;  Surgeon: Doreen Salvage, MD;  Location: [place] [facility];  Service: General;  Laterality: Right;    Family History  Problem Relation Age of Onset   Diabetes Mother    Varicose Veins Mother    Diabetes Paternal Uncle    Hypertension Paternal Uncle    Cancer Maternal Grandmother    Breast cancer Maternal Grandmother 22    Social History   Socioeconomic History   Marital status: Divorced    Spouse name: Not on file   Number of children: Not on file   Years of education: Not on file   Highest education level: Not on file  Occupational History   Not on file  Tobacco Use   Smoking status: Never   Smokeless tobacco: Never   Tobacco comments:    never used tobacco  Vaping Use   Vaping Use: Never used  Substance and Sexual Activity   Alcohol use: No    Alcohol/week: 0.0 standard drinks   Drug use: No   Sexual activity: Yes    Birth control/protection: None  Other Topics Concern   Not on file  Social History Narrative   Not on file   Social Determinants of Health   Financial Resource Strain: Not on file  Food Insecurity: Not on file  Transportation Needs: Not on file  Physical Activity: Not on file  Stress: Not on file  Social Connections: Not on file  Intimate Partner Violence: Not on file  Physical Exam   Vitals:   06/13/21 0359 06/13/21 0415  BP: (!) 119/93 118/85  Pulse: 80 74  Resp: 16 18  Temp: 98.3 F (36.8 C)   SpO2: 98% 100%    CONSTITUTIONAL: Well-appearing, NAD NEURO:  Alert and oriented x 3, no focal deficits EYES:  eyes equal and reactive ENT/NECK:  no LAD, no JVD CARDIO: Regular rate, well-perfused, normal S1 and S2 PULM:  CTAB no wheezing or rhonchi GI/GU:  normal bowel sounds, non-distended, non-tender MSK/SPINE:  No gross deformities, no edema SKIN:  no rash, atraumatic PSYCH:  Appropriate speech and behavior  *Additional and/or pertinent findings included in MDM below  Diagnostic and  Interventional Summary    EKG Interpretation  Date/Time:    Ventricular Rate:    PR Interval:    QRS Duration:   QT Interval:    QTC Calculation:   R Axis:     Text Interpretation:         Labs Reviewed - No data to display  CT ABDOMEN PELVIS WO CONTRAST  Final Result      Medications - No data to display   Procedures  /  Critical Care Procedures  ED Course and Medical Decision Making  I have reviewed the triage vital signs, the nursing notes, and pertinent available records from the EMR.  Listed above are laboratory and imaging tests that I personally ordered, reviewed, and interpreted and then considered in my medical decision making (see below for details).  Abdomen is soft and nontender, not significantly distended.  Vital signs are normal.  Overall favoring an uncomplicated case of constipation, however given the patient denies having any passage of gas in the past 24 hours will obtain CT to exclude signs of obstruction.     CT scan is reassuring, patient is appropriate for discharge.  Barth Kirks. Sedonia Small, [place] [email or web address]  Final Clinical Impressions(s) / ED Diagnoses     ICD-10-CM   1. Constipation, unspecified constipation type  K59.00       ED Discharge Orders          Ordered    polyethylene glycol (GOLYTELY) 236 g solution   Once        06/13/21 0518             Discharge Instructions Discussed with and Provided to Patient:     Discharge Instructions      You were evaluated in the Emergency Department and after careful evaluation, we did not find any emergent condition requiring admission or further testing in the hospital.  Your exam/testing today was overall reassuring.  CT scan did not show any emergencies or obstructions.  Recommend more aggressive bowel regimen at home.  Use the GoLytely medication as we discussed.  Please return to the Emergency Department if you  experience any worsening of your condition.  Thank you for allowing [place] to be a part of your care.         Maudie Flakes, MD 06/13/21 [phone]

## 2021-08-10 ENCOUNTER — Ambulatory Visit: Payer: Self-pay | Admitting: Obstetrics & Gynecology

## 2021-11-15 ENCOUNTER — Encounter (HOSPITAL_BASED_OUTPATIENT_CLINIC_OR_DEPARTMENT_OTHER): Payer: Self-pay | Admitting: Emergency Medicine

## 2021-11-15 ENCOUNTER — Emergency Department (HOSPITAL_BASED_OUTPATIENT_CLINIC_OR_DEPARTMENT_OTHER)
Admission: EM | Admit: 2021-11-15 | Discharge: 2021-11-15 | Disposition: A | Discharge status: Home / Self Care | Payer: Self-pay | Attending: Emergency Medicine | Admitting: Emergency Medicine

## 2021-11-15 ENCOUNTER — Other Ambulatory Visit: Payer: Self-pay

## 2021-11-15 DIAGNOSIS — J029 Acute pharyngitis, unspecified: Secondary | ICD-10-CM | POA: Insufficient documentation

## 2021-11-15 DIAGNOSIS — Z20822 Contact with and (suspected) exposure to covid-19: Secondary | ICD-10-CM | POA: Insufficient documentation

## 2021-11-15 LAB — RESP PANEL BY RT-PCR (FLU A&B, COVID) ARPGX2
Influenza A by PCR: NEGATIVE
Influenza B by PCR: NEGATIVE
SARS Coronavirus 2 by RT PCR: NEGATIVE

## 2021-11-15 LAB — GROUP A STREP BY PCR: Group A Strep by PCR: NOT DETECTED

## 2021-11-15 MED ORDER — DEXAMETHASONE 4 MG PO TABS
10.0000 mg | ORAL_TABLET | Freq: Once | ORAL | Status: AC
  Administered 2021-11-15: 10 mg via ORAL
  Filled 2021-11-15: qty 3

## 2021-11-15 NOTE — ED Triage Notes (Signed)
Pt arrives pov, steady gait with c/o URI symptoms with sore throat starting yesterday. Worked in yard on Sunday ?

## 2021-11-15 NOTE — ED Notes (Signed)
Pt discharged to home. Discharge instructions have been discussed with patient and/or family members. Pt verbally acknowledges understanding d/c instructions, and endorses comprehension to checkout at registration before leaving. Pt ambulatory with steady gait  ?

## 2021-11-15 NOTE — ED Notes (Signed)
ED Provider at bedside. 

## 2021-11-15 NOTE — Discharge Instructions (Addendum)
You have been treated with a long-acting steroid.  Recommend Tylenol and ibuprofen as needed for pain.  Follow-up your MyChart later this morning for your strep test and COVID testing.  If your strep test is positive I will send an antibiotic to your pharmacy.  My suspicion if your viral testing and strep test are negative that this is likely allergy related. ?

## 2021-11-15 NOTE — ED Provider Notes (Signed)
?[facility] EMERGENCY DEPARTMENT ?Provider Note ? ? ?CSN: [id number] ?Arrival date & time: 11/15/21  0725 ? ?  ? ?History ? ?Chief Complaint  ?Patient presents with  ? Sore Throat  ? ? ?[name] [name] is a 51 y.o. female. ? ?The history is provided by the patient.  ?Sore Throat ?This is a new problem. The current episode started 2 days ago. The problem occurs daily. The problem has not changed since onset.Pertinent negatives include no chest pain, no abdominal pain, no headaches and no shortness of breath. Nothing aggravates the symptoms. Nothing relieves the symptoms. She has tried nothing for the symptoms. The treatment provided no relief.  ? ?  ? ?Home Medications ?Prior to Admission medications   ?Medication Sig Start Date End Date Taking? Authorizing Provider  ?Acetaminophen-Guaifenesin 650-400 MG TABS Take by mouth. 07/26/09   [provider]  ?folic acid (FOLVITE) 1 MG tablet Take 1 mg by mouth daily.    [provider]  ?gabapentin (NEURONTIN) 600 MG tablet Take 1 tablet (600 mg total) by mouth at bedtime. 10/15/20   Anyanwu, Sallyanne Havers, MD  ?ketoconazole (NIZORAL) 2 % cream Apply topically. 02/17/19   [provider]  ?multivitamin-iron-minerals-folic acid (THERAPEUTIC-M) TABS tablet Take 1 tablet by mouth daily.    [provider]  ?naproxen (NAPROSYN) 500 MG tablet Take 1 tablet (500 mg total) by mouth 2 (two) times daily with a meal. As needed for pain 01/31/18   Lavonia Drafts, MD  ?ondansetron (ZOFRAN) 4 MG tablet Take 1 tablet (4 mg total) by mouth every 6 (six) hours. 05/06/21   Lennice Sites, DO  ?Vitamin D, Ergocalciferol, (DRISDOL) 50000 units CAPS capsule Take 50,000 Units by mouth every 7 (seven) days.    [provider]  ?   ? ?Allergies    ?Aspirin, Coconut oil, and Coconut fatty acids   ? ?Review of Systems   ?Review of Systems  ?Respiratory:  Negative for shortness of breath.   ?Cardiovascular:  Negative for chest pain.   ?Gastrointestinal:  Negative for abdominal pain.  ?Neurological:  Negative for headaches.  ? ?Physical Exam ?Updated Vital Signs ?BP (!) 128/91   Pulse 86   Temp 98.4 ?F (36.9 ?C) (Oral)   Resp 18   Ht '5\' 6"'$  (1.676 m)   Wt 101.6 kg   SpO2 98%   BMI 36.15 kg/m?  ?Physical Exam ?Vitals and nursing note reviewed.  ?Constitutional:   ?   General: She is not in acute distress. ?   Appearance: She is well-developed.  ?HENT:  ?   Head: Normocephalic and atraumatic.  ?   Mouth/Throat:  ?   Pharynx: Uvula midline. Posterior oropharyngeal erythema present. No pharyngeal swelling, oropharyngeal exudate or uvula swelling.  ?   Tonsils: No tonsillar exudate or tonsillar abscesses.  ?Eyes:  ?   Conjunctiva/sclera: Conjunctivae normal.  ?Cardiovascular:  ?   Rate and Rhythm: Normal rate and regular rhythm.  ?   Heart sounds: Normal heart sounds. No murmur heard. ?Pulmonary:  ?   Effort: Pulmonary effort is normal. No respiratory distress.  ?   Breath sounds: Normal breath sounds.  ?Abdominal:  ?   Palpations: Abdomen is soft.  ?   Tenderness: There is no abdominal tenderness.  ?Musculoskeletal:     ?   General: No swelling.  ?   Cervical back: Neck supple.  ?Skin: ?   General: Skin is warm and dry.  ?   Capillary Refill: Capillary refill takes less than  2 seconds.  ?Neurological:  ?   Mental Status: She is alert.  ?Psychiatric:     ?   Mood and Affect: Mood normal.  ? ? ?ED Results / Procedures / Treatments   ?Labs ?(all labs ordered are listed, but only abnormal results are displayed) ?Labs Reviewed  ?RESP PANEL BY RT-PCR (FLU A&B, COVID) [id number]  ?GROUP A STREP BY PCR  ? ? ?EKG ?None ? ?Radiology ?No results found. ? ?Procedures ?Procedures  ? ? ?Medications Ordered in ED ?Medications  ?dexamethasone (DECADRON) tablet 10 mg (has no administration in time range)  ? ? ?ED Course/ Medical Decision Making/ A&P ?  ?                        ?Medical Decision Making ?Risk ?Prescription drug management. ? ? ?QUINTANA CANELO is  here with sore throat.  History of autoimmune disease.  Normal vitals.  No fever.  Very well-appearing.  No evidence of exudates or major swelling to the posterior oropharynx.  There is some erythema.  Some cobblestoning.  Some nasal congestion.  Otherwise there is no signs of major neck swelling or submandibular swelling.  Patient with differential diagnosis that includes viral versus strep pharyngitis versus seasonal allergies.  I considered differential diagnosis of peritonsillar or tonsillar abscess but no concern given history and physical.  Will treat with Decadron.  We will call in antibiotic if strep test is positive.  Will test for strep and COVID.  If those are negative suspect that this is seasonal allergy related.  Discharged in good condition.  Recommend follow-up with primary care doctor if needed. ? ?This chart was dictated using voice recognition software.  Despite best efforts to proofread,  errors can occur which can change the documentation meaning.  ? ? ? ? ? ? ? ?Final Clinical Impression(s) / ED Diagnoses ?Final diagnoses:  ?Sore throat  ? ? ?Rx / DC Orders ?ED Discharge Orders   ? ? None  ? ?  ? ? ?  ?Lennice Sites, DO ?11/15/21 9562 ? ?

## 2022-11-02 ENCOUNTER — Other Ambulatory Visit: Payer: Self-pay | Admitting: Family Medicine

## 2022-11-02 ENCOUNTER — Ambulatory Visit
Admission: RE | Admit: 2022-11-02 | Discharge: 2022-11-02 | Disposition: A | Discharge status: Home / Self Care | Payer: Medicaid Other | Source: Ambulatory Visit | Attending: Family Medicine | Admitting: Family Medicine

## 2022-11-02 DIAGNOSIS — Z1231 Encounter for screening mammogram for malignant neoplasm of breast: Secondary | ICD-10-CM

## 2022-12-27 ENCOUNTER — Encounter: Payer: Self-pay | Admitting: Obstetrics & Gynecology

## 2022-12-27 ENCOUNTER — Ambulatory Visit (INDEPENDENT_AMBULATORY_CARE_PROVIDER_SITE_OTHER): Payer: Medicaid Other | Admitting: Obstetrics & Gynecology

## 2022-12-27 ENCOUNTER — Other Ambulatory Visit (HOSPITAL_COMMUNITY)
Admission: RE | Admit: 2022-12-27 | Discharge: 2022-12-27 | Disposition: A | Discharge status: Home / Self Care | Payer: Medicaid Other | Source: Ambulatory Visit | Attending: Obstetrics & Gynecology | Admitting: Obstetrics & Gynecology

## 2022-12-27 VITALS — BP 115/66 | HR 82 | Ht 66.0 in | Wt 216.0 lb

## 2022-12-27 DIAGNOSIS — Z1211 Encounter for screening for malignant neoplasm of colon: Secondary | ICD-10-CM

## 2022-12-27 DIAGNOSIS — Z01419 Encounter for gynecological examination (general) (routine) without abnormal findings: Secondary | ICD-10-CM | POA: Diagnosis present

## 2022-12-27 DIAGNOSIS — Z1339 Encounter for screening examination for other mental health and behavioral disorders: Secondary | ICD-10-CM | POA: Diagnosis not present

## 2022-12-27 NOTE — Progress Notes (Signed)
Subjective:     [name] [name] is a 52 y.o. female here for a routine exam.  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   Gynecologic History No LMP recorded. Patient has had an ablation. Contraception: post menopausal status Last Pap: 01/31/2018. Results were: normal Last mammogram: 11/02/2022. Results were: normal  Obstetric History OB History  Gravida Para Term Preterm AB Living  3 2 2   1 2   SAB IAB Ectopic Multiple Live Births  1            # Outcome Date GA Lbr Len/2nd Weight Sex Delivery Anes PTL Lv  3 SAB           2 Term           1 Term             The following portions of the patient's history were reviewed and updated as appropriate: allergies, current medications, past family history, past medical history, past social history, past surgical history, and problem list.  Review of Systems Pertinent items are noted in HPI.    Objective:  BP 115/66   Pulse 82   Ht 5\' 6"  (1.676 m)   Wt 216 lb (98 kg)   BMI 34.86 kg/m   General Appearance:    Alert, cooperative, no distress, appears stated age  Head:    Normocephalic, without obvious abnormality, atraumatic  Eyes:    conjunctiva/corneas clear, EOM's intact, both eyes  Ears:    Normal external ear canals, both ears  Nose:   Nares normal, septum midline, mucosa normal, no drainage    or sinus tenderness  Throat:   Lips, mucosa, and tongue normal; teeth and gums normal  Neck:   Supple, symmetrical, trachea midline, no adenopathy;    thyroid:  no enlargement/tenderness/nodules  Back:     Symmetric, no curvature, ROM normal, no CVA tenderness  Lungs:     respirations unlabored  Chest Wall:    No tenderness or deformity   Heart:    Regular rate and rhythm  Breast Exam:    No tenderness, masses, or nipple abnormality  Abdomen:     Soft, non-tender, bowel sounds active all four quadrants,    no masses, no organomegaly  Genitalia:    Normal female without lesion, discharge or tenderness      Extremities:   Extremities normal, atraumatic, no cyanosis or edema  Pulses:   2+ and symmetric all extremities  Skin:   Skin color, texture, turgor normal, no rashes or lesions     Assessment:    Healthy female exam.    Plan:  Destyne was seen today for gynecologic exam.  Diagnoses and all orders for this visit:  Well female exam with routine gynecological exam -     Cytology - PAP( [place] [facility])  Colon cancer screening -     Ambulatory referral to Gastroenterology   F/u in 1 year or sooner prn   Alberta Cairns L. Harraway-Smith, M.D., [place] [place]

## 2023-01-01 LAB — CYTOLOGY - PAP
Comment: NEGATIVE
Diagnosis: NEGATIVE
High risk HPV: NEGATIVE

## 2023-03-12 ENCOUNTER — Encounter: Payer: Self-pay | Admitting: Gastroenterology

## 2023-04-18 ENCOUNTER — Encounter: Payer: Self-pay | Admitting: Internal Medicine

## 2023-04-18 ENCOUNTER — Ambulatory Visit: Payer: Medicaid Other | Attending: Internal Medicine | Admitting: Internal Medicine

## 2023-04-18 VITALS — BP 118/66 | HR 64 | Ht 66.0 in | Wt 220.6 lb

## 2023-04-18 DIAGNOSIS — R0789 Other chest pain: Secondary | ICD-10-CM | POA: Diagnosis not present

## 2023-04-18 DIAGNOSIS — R079 Chest pain, unspecified: Secondary | ICD-10-CM | POA: Diagnosis not present

## 2023-04-18 DIAGNOSIS — R0602 Shortness of breath: Secondary | ICD-10-CM

## 2023-04-18 NOTE — Progress Notes (Signed)
Cardiology Office Note:    Date:  04/18/2023   ID:  [name] [name], DOB June 29, 1971, MRN [id number]  PCP:  Leilani Able, MD   [facility] Providers Cardiologist:  None     Referring MD: Leilani Able, MD   No chief complaint on file. Sharp CP  History of Present Illness:    [name] [name] is a 52 y.o. female referral for chest pain  She has hx of myelofibrosis, and RA follows with [facility]. She reported sharp CP that is not associated with exercise.   Mrs. Over reports sharp chest pain that has been occurring for approximately four months since April. The pain is described as a sharp poke inside the chest, which worsens with deep breaths and certain movements, such as turning to the side. The patient reports that sitting still and taking short breaths helps alleviate the pain. The episodes of chest pain occur randomly, sometimes days or a week apart.  She has a hx of recurrent pericarditis (11/02). However, the patient denies having a flare at the time of the visit. She was on plaquenil  The patient has a family history of heart disease, with their father experiencing a heart attack at the age of 93.   Non smoker      Past Medical History:  Diagnosis Date   Anemia    Arthritis    Myleofibrosis- bone marrow deteriorating   Blood transfusion without reported diagnosis    Myelofibrosis (HCC)    Pericarditis 2004-2007   Reoccuring: without flare since 2007   Shortness of breath dyspnea    on exertion when having flare up of mylofibrosis or iron deficient    Past Surgical History:  Procedure Laterality Date   BREAST EXCISIONAL BIOPSY Right 2017   CESAREAN SECTION     x1   DILITATION & CURRETTAGE/HYSTROSCOPY WITH NOVASURE ABLATION N/A 04/20/2015   Procedure: DILATATION & CURETTAGE/HYSTEROSCOPY WITH NOVASURE ABLATION/RESECTION FIBROID WITH MYOSURE;  Surgeon: Willodean Rosenthal, MD;  Location: [facility];  Service: Gynecology;  Laterality: N/A;    GREAT TOE ARTHRODESIS, INTERPHALANGEAL JOINT  2011   left   LIPOMA EXCISION Right 06/26/2014   Procedure: EXCISION RIGHT AXILLA  LIPOMA;  Surgeon: Frederik Schmidt, MD;  Location: [facility];  Service: General;  Laterality: Right;   PORT A CATH REVISION     insertion-[facility]   PORT-A-CATH REMOVAL Right 06/26/2014   Procedure: REMOVAL PORT-A-CATH;  Surgeon: Frederik Schmidt, MD;  Location: [facility];  Service: General;  Laterality: Right;    Current Medications: No outpatient medications have been marked as taking for the 04/18/23 encounter (Appointment) with Maisie Fus, MD.     Allergies:   Aspirin, Coconut (cocos nucifera), and Coconut fatty acids   Social History   Socioeconomic History   Marital status: Divorced    Spouse name: Not on file   Number of children: Not on file   Years of education: Not on file   Highest education level: Not on file  Occupational History   Not on file  Tobacco Use   Smoking status: Never   Smokeless tobacco: Never   Tobacco comments:    never used tobacco  Vaping Use   Vaping status: Never Used  Substance and Sexual Activity   Alcohol use: Not Currently   Drug use: No   Sexual activity: Yes    Birth control/protection: None  Other Topics Concern   Not on file  Social History Narrative   Not on file  Social Determinants of Health   Financial Resource Strain: Not on file  Food Insecurity: No Food Insecurity (08/09/2022)   Received from [facility], [facility]   Hunger Vital Sign    Worried About Running Out of Food in the Last Year: Never true    Ran Out of Food in the Last Year: Never true  Transportation Needs: No Transportation Needs (08/09/2022)   Received from [facility], [facility]   [facility] - Transportation    In the past 12 months, has lack of transportation kept you from medical appointments or from getting medications?: No    Lack  of Transportation (Non-Medical): No  Physical Activity: Not on file  Stress: Not on file  Social Connections: Not on file     Family History: The patient's family history includes Breast cancer (age of onset: 74) in her maternal grandmother; Cancer in her maternal grandmother; Diabetes in her mother and paternal uncle; Hypertension in her paternal uncle; Varicose Veins in her mother.  ROS:   Please see the history of present illness.     All other systems reviewed and are negative.  EKGs/Labs/Other Studies Reviewed:    The following studies were reviewed today: EKG Interpretation Date/Time:  Wednesday April 18 2023 08:41:47 EDT Ventricular Rate:  64 PR Interval:  144 QRS Duration:  84 QT Interval:  382 QTC Calculation: 394 R Axis:   53  Text Interpretation: Normal sinus rhythm Normal ECG When compared with ECG of 06-May-2021 08:21, PREVIOUS ECG IS PRESENT Confirmed by Carolan Clines ([id number]) on 04/18/2023 8:57:34 AM    Recent Labs: No results found for requested labs within last 365 days.  Recent Lipid Panel No results found for: "CHOL", "TRIG", "HDL", "CHOLHDL", "VLDL", "LDLCALC", "LDLDIRECT"   Risk Assessment/Calculations:    Physical Exam:    VS: Vitals:   04/18/23 0843  BP: 118/66  Pulse: 64  SpO2: 96%     Wt Readings from Last 3 Encounters:  12/27/22 216 lb (98 kg)  11/15/21 224 lb (101.6 kg)  06/13/21 218 lb (98.9 kg)     GEN: Well nourished, well developed in no acute distress HEENT: Normal NECK: No JVD; No carotid bruits CARDIAC: RRR, no murmurs, rubs, gallops RESPIRATORY:  Clear to auscultation without rales, wheezing or rhonchi  ABDOMEN: Soft, non-tender, non-distended MUSCULOSKELETAL:  No edema; No deformity  SKIN: Warm and dry NEUROLOGIC:  Alert and oriented x 3 PSYCHIATRIC:  Normal affect   ASSESSMENT:    Pleuritic CP Hx of recurring pericarditis - has hx of RA. Ddx includes pericarditis. Low risk for CAD.  -EKG shows no signs of  pericarditis - considering she does not think this is related to her prior recurrence of pericarditis and lack of signs; will defer medication at this time - signs/symptoms not typical of CAD PLAN:    In order of problems listed above:  TTE ( look for pericardial effusion) ESR, CRP Follow up PRN       Medication Adjustments/Labs and Tests Ordered: Current medicines are reviewed at length with the patient today.  Concerns regarding medicines are outlined above.  No orders of the defined types were placed in this encounter.  No orders of the defined types were placed in this encounter.   There are no Patient Instructions on file for this visit.   Signed, Maisie Fus, MD  04/18/2023 8:09 AM    [facility]

## 2023-04-18 NOTE — Patient Instructions (Signed)
Medication Instructions:  Your physician recommends that you continue on your current medications as directed. Please refer to the Current Medication list given to you today.  *If you need a refill on your cardiac medications before your next appointment, please call your pharmacy*   Lab Work: ESR CRP  If you have labs (blood work) drawn today and your tests are completely normal, you will receive your results only by: MyChart Message (if you have MyChart) OR A paper copy in the mail If you have any lab test that is abnormal or we need to change your treatment, we will call you to review the results.   Testing/Procedures: Your physician has requested that you have an echocardiogram. Echocardiography is a painless test that uses sound waves to create images of your heart. It provides your doctor with information about the size and shape of your heart and how well your heart's chambers and valves are working. This procedure takes approximately one hour. There are no restrictions for this procedure. Please do NOT wear cologne, perfume, aftershave, or lotions (deodorant is allowed). Please arrive 15 minutes prior to your appointment time.   Follow-Up: At [facility], you and your health needs are our priority.  As part of our continuing mission to provide you with exceptional heart care, we have created designated Provider Care Teams.  These Care Teams include your primary Cardiologist (physician) and Advanced Practice Providers (APPs -  Physician Assistants and Nurse Practitioners) who all work together to provide you with the care you need, when you need it.     Your next appointment:   F/U as needed  Provider:   Maisie Fus, MD

## 2023-05-07 ENCOUNTER — Ambulatory Visit (HOSPITAL_COMMUNITY): Payer: Medicaid Other | Attending: Internal Medicine

## 2023-05-07 DIAGNOSIS — R079 Chest pain, unspecified: Secondary | ICD-10-CM | POA: Diagnosis not present

## 2023-05-07 LAB — ECHOCARDIOGRAM COMPLETE
Area-P 1/2: 3.99 cm2
S' Lateral: 2.5 cm

## 2023-05-23 ENCOUNTER — Ambulatory Visit (INDEPENDENT_AMBULATORY_CARE_PROVIDER_SITE_OTHER): Payer: Medicaid Other | Admitting: Physician Assistant

## 2023-05-23 ENCOUNTER — Encounter: Payer: Self-pay | Admitting: Physician Assistant

## 2023-05-23 VITALS — BP 110/72 | HR 76 | Ht 66.0 in | Wt 221.1 lb

## 2023-05-23 DIAGNOSIS — K219 Gastro-esophageal reflux disease without esophagitis: Secondary | ICD-10-CM

## 2023-05-23 DIAGNOSIS — R6881 Early satiety: Secondary | ICD-10-CM

## 2023-05-23 DIAGNOSIS — Z1211 Encounter for screening for malignant neoplasm of colon: Secondary | ICD-10-CM | POA: Diagnosis not present

## 2023-05-23 DIAGNOSIS — K5900 Constipation, unspecified: Secondary | ICD-10-CM

## 2023-05-23 NOTE — Progress Notes (Signed)
Chief Complaint: Abdominal pain  HPI:    [name] [name] is a 52 year old female with a past medical history as listed below including rheumatoid arthritis, who was referred to me by Leilani Able, MD for a complaint of abdominal pain.      05/11/2017 CT of the abdomen pelvis with contrast showed mild thickening at the GE junction which was thought to be secondary to reflux or esophagitis.    04/18/2023 patient seen by cardiology for midsternal chest pain.  At that time described as sharp chest pain that been occurring for 4 months described as a sharp poke.  Worse with deep breaths and certain movements.  Apparently has history of recurrent pericarditis.  At that time recommended TTE to look for pericardial effusion as well as ESR and CRP.    04/18/2023 ESR and CRP normal.    05/07/2023 echo was normal.    Today, the patient presents to clinic and tells me that for the past 3 to 5 months she has been having issues with reflux and a scratchy throat, apparently was having some other issues as well which they thought maybe was related to her myelofibrosis but ruled that out, then thought maybe it had to do with her RA but that was ruled out as well.  She continues having "stomach issues", which no one has really explained.  She would prefer not to start any medications until she knows what is going on.  Associated symptoms include early satiety over the past 4 to 6 months.    Also describes a change to constipation, initially started with iron supplementation, but she has been off of iron for a while now and remains constipated, only able to have a bowel movement every 2 to 3 days with the use of an herbal tea.  On the days in between she does get bloated and nauseous.    Also tells me she knows she is due for a screening colonoscopy.       Denies fever, chills, blood in her stool or weight loss.  Past Medical History:  Diagnosis Date   Anemia    Arthritis    Myleofibrosis- bone marrow deteriorating    Blood transfusion without reported diagnosis    Myelofibrosis (HCC)    Pericarditis 2004-2007   Reoccuring: without flare since 2007   Shortness of breath dyspnea    on exertion when having flare up of mylofibrosis or iron deficient    Past Surgical History:  Procedure Laterality Date   BREAST EXCISIONAL BIOPSY Right 2017   CESAREAN SECTION     x1   DILITATION & CURRETTAGE/HYSTROSCOPY WITH NOVASURE ABLATION N/A 04/20/2015   Procedure: DILATATION & CURETTAGE/HYSTEROSCOPY WITH NOVASURE ABLATION/RESECTION FIBROID WITH MYOSURE;  Surgeon: Willodean Rosenthal, MD;  Location: [facility];  Service: Gynecology;  Laterality: N/A;   GREAT TOE ARTHRODESIS, INTERPHALANGEAL JOINT  2011   left   LIPOMA EXCISION Right 06/26/2014   Procedure: EXCISION RIGHT AXILLA  LIPOMA;  Surgeon: Frederik Schmidt, MD;  Location: [facility];  Service: General;  Laterality: Right;   PORT A CATH REVISION     insertion-[facility]   PORT-A-CATH REMOVAL Right 06/26/2014   Procedure: REMOVAL PORT-A-CATH;  Surgeon: Frederik Schmidt, MD;  Location: [facility];  Service: General;  Laterality: Right;    Current Outpatient Medications  Medication Sig Dispense Refill   Acetaminophen-Guaifenesin 650-400 MG TABS Take by mouth.     folic acid (FOLVITE) 1 MG tablet Take 1 mg by mouth daily.  gabapentin (NEURONTIN) 600 MG tablet Take 1 tablet (600 mg total) by mouth at bedtime. 30 tablet 3   ketoconazole (NIZORAL) 2 % cream Apply topically.     multivitamin-iron-minerals-folic acid (THERAPEUTIC-M) TABS tablet Take 1 tablet by mouth daily.     naproxen (NAPROSYN) 500 MG tablet Take 1 tablet (500 mg total) by mouth 2 (two) times daily with a meal. As needed for pain 60 tablet 2   PREDNISONE, PAK, PO Prednisone     Vitamin D, Ergocalciferol, (DRISDOL) 50000 units CAPS capsule Take 50,000 Units by mouth every 7 (seven) days.     No current facility-administered medications for this visit.    Allergies as of 05/23/2023 -  Review Complete 04/18/2023  Allergen Reaction Noted   Aspirin Itching and Shortness Of Breath 07/03/2011   Coconut (cocos nucifera) Itching and Other (See Comments) 09/27/2011   Coconut fatty acids  07/30/2013    Family History  Problem Relation Age of Onset   Diabetes Mother    Varicose Veins Mother    Diabetes Paternal Uncle    Hypertension Paternal Uncle    Cancer Maternal Grandmother    Breast cancer Maternal Grandmother 11    Social History   Socioeconomic History   Marital status: Divorced    Spouse name: Not on file   Number of children: Not on file   Years of education: Not on file   Highest education level: Not on file  Occupational History   Not on file  Tobacco Use   Smoking status: Never   Smokeless tobacco: Never   Tobacco comments:    never used tobacco  Vaping Use   Vaping status: Never Used  Substance and Sexual Activity   Alcohol use: Not Currently   Drug use: No   Sexual activity: Yes    Birth control/protection: None  Other Topics Concern   Not on file  Social History Narrative   Not on file   Social Determinants of Health   Financial Resource Strain: Not on file  Food Insecurity: No Food Insecurity (08/09/2022)   Received from [facility], [facility]   Hunger Vital Sign    Worried About Running Out of Food in the Last Year: Never true    Ran Out of Food in the Last Year: Never true  Transportation Needs: No Transportation Needs (08/09/2022)   Received from [facility], [facility]   PRAPARE - Transportation    In the past 12 months, has lack of transportation kept you from medical appointments or from getting medications?: No    Lack of Transportation (Non-Medical): No  Physical Activity: Not on file  Stress: Not on file  Social Connections: Not on file  Intimate Partner Violence: Not on file    Review of Systems:    Constitutional: No weight loss, fever or  chills Skin: No rash  Cardiovascular: No chest pain  Respiratory: No SOB  Gastrointestinal: See HPI and otherwise negative Genitourinary: No dysuria  Neurological: No headache, dizziness or syncope Musculoskeletal: No new muscle or joint pain Hematologic: No bleeding Psychiatric: No history of depression or anxiety   Physical Exam:  Vital signs: BP 110/72 (BP Location: Left Arm, Patient Position: Sitting, Cuff Size: Normal)   Pulse 76   Ht 5\' 6"  (1.676 m)   Wt 221 lb 2 oz (100.3 kg)   BMI 35.69 kg/m    Constitutional:   Pleasant overweight AA female appears to be in NAD, Well developed,  Well nourished, alert and cooperative Head:  Normocephalic and atraumatic. Eyes:   PEERL, EOMI. No icterus. Conjunctiva pink. Ears:  Normal auditory acuity. Neck:  Supple Throat: Oral cavity and pharynx without inflammation, swelling or lesion.  Respiratory: Respirations even and unlabored. Lungs clear to auscultation bilaterally.   No wheezes, crackles, or rhonchi.  Cardiovascular: Normal S1, S2. No MRG. Regular rate and rhythm. No peripheral edema, cyanosis or pallor.  Gastrointestinal:  Soft, nondistended, nontender. No rebound or guarding. Normal bowel sounds. No appreciable masses or hepatomegaly. Rectal:  Not performed.  Msk:  Symmetrical without gross deformities. Without edema, no deformity or joint abnormality.  Neurologic:  Alert and  oriented x4;  grossly normal neurologically.  Skin:   Dry and intact without significant lesions or rashes. Psychiatric: Demonstrates good judgement and reason without abnormal affect or behaviors.  RELEVANT LABS AND IMAGING: CBC    Component Value Date/Time   WBC 3.5 (L) 05/06/2021 0903   RBC 4.01 05/06/2021 0903   HGB 12.0 05/06/2021 0903   HGB 11.7 10/15/2020 0910   HGB 12.2 04/16/2017 0810   HCT 36.6 05/06/2021 0903   HCT 35.9 10/15/2020 0910   HCT 35.9 04/16/2017 0810   PLT 286 05/06/2021 0903   PLT 324 10/15/2020 0910   MCV 91.3 05/06/2021  0903   MCV 89 10/15/2020 0910   MCV 91 04/16/2017 0810   MCH 29.9 05/06/2021 0903   MCHC 32.8 05/06/2021 0903   RDW 12.4 05/06/2021 0903   RDW 12.0 10/15/2020 0910   RDW 11.8 04/16/2017 0810   LYMPHSABS 1.6 05/06/2021 0903   LYMPHSABS 1.7 04/16/2017 0810   MONOABS 0.3 05/06/2021 0903   EOSABS 0.1 05/06/2021 0903   EOSABS 0.1 04/16/2017 0810   BASOSABS 0.0 05/06/2021 0903   BASOSABS 0.0 04/16/2017 0810    CMP     Component Value Date/Time   NA 137 05/06/2021 0903   NA 141 10/15/2020 0910   NA 139 04/16/2017 0810   K 4.1 05/06/2021 0903   K 4.3 04/16/2017 0810   CL 104 05/06/2021 0903   CL 105 10/18/2015 0940   CO2 28 05/06/2021 0903   CO2 24 04/16/2017 0810   GLUCOSE 98 05/06/2021 0903   GLUCOSE 85 04/16/2017 0810   GLUCOSE 118 10/18/2015 0940   BUN 12 05/06/2021 0903   BUN 14 10/15/2020 0910   BUN 11.5 04/16/2017 0810   CREATININE 0.74 05/06/2021 0903   CREATININE 0.78 12/11/2019 1256   CREATININE 0.8 04/16/2017 0810   CALCIUM 8.9 05/06/2021 0903   CALCIUM 9.4 04/16/2017 0810   PROT 6.9 05/06/2021 0903   PROT 6.5 10/15/2020 0910   PROT 6.9 04/16/2017 0810   ALBUMIN 3.9 05/06/2021 0903   ALBUMIN 4.2 10/15/2020 0910   ALBUMIN 3.7 04/16/2017 0810   AST 13 (L) 05/06/2021 0903   AST 12 (L) 12/11/2019 1256   AST 18 04/16/2017 0810   ALT 13 05/06/2021 0903   ALT 11 12/11/2019 1256   ALT 15 04/16/2017 0810   ALKPHOS 49 05/06/2021 0903   ALKPHOS 46 04/16/2017 0810   BILITOT 0.8 05/06/2021 0903   BILITOT 0.3 10/15/2020 0910   BILITOT 0.4 12/11/2019 1256   BILITOT 0.45 04/16/2017 0810   GFRNONAA >60 05/06/2021 0903   GFRNONAA >60 12/11/2019 1256   GFRAA 107 10/15/2020 0910   GFRAA >60 12/11/2019 1256    Assessment: 1.  GERD: Describes an increase in reflux symptoms and a scratchy throat as well as feeling full faster with small meals over the  past 4 to 6 months; consider gastritis +/- H. pylori 2.  Early satiety: With above 3.  Constipation: Change over the  past 4 to 5 months, now requiring herbal teas in order to have a bowel movement; consider relation to other medications versus IBS versus slow transit 4.  Screening for colorectal cancer: Patient is 27 and never had screening for colon cancer  Plan: 1.  Scheduled patient for screening colonoscopy and a diagnostic EGD in the [facility] with Dr. Meridee Score.  Did provide the patient with  a detailed list of risks  for the procedures and she agrees to proceed. Patient is appropriate for endoscopic procedure(s) in the ambulatory ([facility]) setting.  2.  Discussed starting a PPI or H2 blocker but patient would like to avoid any unnecessary medicines and wants to have her EGD first.  Reviewed an antireflux diet and lifestyle modifications. 3.  Patient will have a 2-day bowel prep for her colonoscopy given history of constipation. 4.  Discussed the use of MiraLAX on a daily basis if she wants to try something other than herbal tea. 5.  Patient to follow in clinic per recommendations after procedures above.  Hyacinth Meeker, PA-C [facility] 05/23/2023, 11:07 AM  Cc: Leilani Able, MD

## 2023-05-23 NOTE — Patient Instructions (Signed)
You have been scheduled for an endoscopy and colonoscopy. Please follow the written instructions given to you at your visit today.  Please pick up your prep supplies at the pharmacy within the next 1-3 days.  If you use inhalers (even only as needed), please bring them with you on the day of your procedure.  DO NOT TAKE 7 DAYS PRIOR TO TEST- Trulicity (dulaglutide) Ozempic, Wegovy (semaglutide) Mounjaro (tirzepatide) Bydureon Bcise (exanatide extended release)  DO NOT TAKE 1 DAY PRIOR TO YOUR TEST Rybelsus (semaglutide) Adlyxin (lixisenatide) Victoza (liraglutide) Byetta (exanatide) ___________________________________________________________________________   Follow up per recommendations after procedures  Due to recent changes in healthcare laws, you may see the results of your imaging and laboratory studies on MyChart before your provider has had a chance to review them.  We understand that in some cases there may be results that are confusing or concerning to you. Not all laboratory results come back in the same time frame and the provider may be waiting for multiple results in order to interpret others.  Please give [place] 48 hours in order for your provider to thoroughly review all the results before contacting the office for clarification of your results.    _______________________________________________________  If your blood pressure at your visit was 140/90 or greater, please contact your primary care physician to follow up on this.  _______________________________________________________  If you are age 64 or older, your body mass index should be between 23-30. Your Body mass index is 35.69 kg/m. If this is out of the aforementioned range listed, please consider follow up with your Primary Care Provider.  If you are age 51 or younger, your body mass index should be between 19-25. Your Body mass index is 35.69 kg/m. If this is out of the aformentioned range listed, please  consider follow up with your Primary Care Provider.   ________________________________________________________  The [facility] providers would like to encourage you to use [facility] to communicate with providers for non-urgent requests or questions.  Due to long hold times on the telephone, sending your provider a message by [facility] may be a faster and more efficient way to get a response.  Please allow 48 business hours for a response.  Please remember that this is for non-urgent requests.  _______________________________________________________   I appreciate the  opportunity to care for you  Thank You   Jacelyn Grip

## 2023-05-27 NOTE — Progress Notes (Signed)
Attending Physician's Attestation   I have reviewed the chart.   I agree with the Advanced Practitioner's note, impression, and recommendations with any updates as below.    Corliss Parish, MD [facility] Office # [phone]

## 2023-07-13 ENCOUNTER — Ambulatory Visit (AMBULATORY_SURGERY_CENTER): Payer: Medicaid Other | Admitting: Gastroenterology

## 2023-07-13 ENCOUNTER — Encounter: Payer: Self-pay | Admitting: Gastroenterology

## 2023-07-13 ENCOUNTER — Other Ambulatory Visit: Payer: Self-pay

## 2023-07-13 VITALS — BP 120/79 | HR 63 | Temp 97.8°F | Resp 11 | Ht 66.0 in | Wt 221.2 lb

## 2023-07-13 DIAGNOSIS — R0789 Other chest pain: Secondary | ICD-10-CM

## 2023-07-13 DIAGNOSIS — K2970 Gastritis, unspecified, without bleeding: Secondary | ICD-10-CM | POA: Diagnosis not present

## 2023-07-13 DIAGNOSIS — R1310 Dysphagia, unspecified: Secondary | ICD-10-CM | POA: Diagnosis not present

## 2023-07-13 DIAGNOSIS — K219 Gastro-esophageal reflux disease without esophagitis: Secondary | ICD-10-CM

## 2023-07-13 DIAGNOSIS — K5900 Constipation, unspecified: Secondary | ICD-10-CM

## 2023-07-13 DIAGNOSIS — Z1211 Encounter for screening for malignant neoplasm of colon: Secondary | ICD-10-CM

## 2023-07-13 DIAGNOSIS — R6881 Early satiety: Secondary | ICD-10-CM

## 2023-07-13 DIAGNOSIS — K319 Disease of stomach and duodenum, unspecified: Secondary | ICD-10-CM | POA: Diagnosis not present

## 2023-07-13 MED ORDER — SODIUM CHLORIDE 0.9 % IV SOLN: 500.0000 mL | Freq: Once | INTRAVENOUS | Status: DC

## 2023-07-13 MED ORDER — FENTANYL CITRATE (PF) 100 MCG/2ML IJ SOLN: 25.0000 ug | Freq: Once | INTRAMUSCULAR | Status: AC

## 2023-07-13 MED ORDER — OMEPRAZOLE 20 MG PO CPDR: DELAYED_RELEASE_CAPSULE | ORAL | 2 refills | Status: DC

## 2023-07-13 MED ORDER — SUCRALFATE 1 GM/10ML PO SUSP: ORAL | 1 refills | Status: DC

## 2023-07-13 MED ORDER — AMBULATORY NON FORMULARY MEDICATION: 30.0000 mL | Freq: Three times a day (TID) | Status: AC

## 2023-07-13 MED ORDER — SUCRALFATE 1 G PO TABS: 1.0000 g | ORAL_TABLET | Freq: Two times a day (BID) | ORAL | 0 refills | Status: DC

## 2023-07-13 NOTE — Progress Notes (Signed)
GASTROENTEROLOGY PROCEDURE H&P NOTE   Primary Care Physician: Leilani Able, MD  HPI: [name] [name] is a 52 y.o. female who presents for EGD/Colonoscopy for evaluation of abnormal imaging, GERD, Colon Cancer screening.  Past Medical History:  Diagnosis Date   Anemia    Arthritis    Myleofibrosis- bone marrow deteriorating   Blood transfusion without reported diagnosis    Chronic headaches    Myelofibrosis (HCC)    Pericarditis 2004-2007   Reoccuring: without flare since 2007   Shortness of breath dyspnea    on exertion when having flare up of mylofibrosis or iron deficient   Past Surgical History:  Procedure Laterality Date   BREAST EXCISIONAL BIOPSY Right 2017   CESAREAN SECTION     x1   DILITATION & CURRETTAGE/HYSTROSCOPY WITH NOVASURE ABLATION N/A 04/20/2015   Procedure: DILATATION & CURETTAGE/HYSTEROSCOPY WITH NOVASURE ABLATION/RESECTION FIBROID WITH MYOSURE;  Surgeon: Willodean Rosenthal, MD;  Location: [facility];  Service: Gynecology;  Laterality: N/A;   GREAT TOE ARTHRODESIS, INTERPHALANGEAL JOINT  2011   left   LIPOMA EXCISION Right 06/26/2014   Procedure: EXCISION RIGHT AXILLA  LIPOMA;  Surgeon: Frederik Schmidt, MD;  Location: [facility];  Service: General;  Laterality: Right;   PORT A CATH REVISION     insertion-[facility]   PORT-A-CATH REMOVAL Right 06/26/2014   Procedure: REMOVAL PORT-A-CATH;  Surgeon: Frederik Schmidt, MD;  Location: [facility];  Service: General;  Laterality: Right;   Current Outpatient Medications  Medication Sig Dispense Refill   Acetaminophen-Guaifenesin 650-400 MG TABS Take by mouth.     folic acid (FOLVITE) 1 MG tablet Take 1 mg by mouth daily.     gabapentin (NEURONTIN) 600 MG tablet Take 1 tablet (600 mg total) by mouth at bedtime. 30 tablet 3   ketoconazole (NIZORAL) 2 % cream Apply topically.     multivitamin-iron-minerals-folic acid (THERAPEUTIC-M) TABS tablet Take 1 tablet by mouth daily.     naproxen (NAPROSYN) 500  MG tablet Take 1 tablet (500 mg total) by mouth 2 (two) times daily with a meal. As needed for pain 60 tablet 2   PREDNISONE, PAK, PO Prednisone     Vitamin D, Ergocalciferol, (DRISDOL) 50000 units CAPS capsule Take 50,000 Units by mouth every 7 (seven) days.     No current facility-administered medications for this visit.    Current Outpatient Medications:    Acetaminophen-Guaifenesin 650-400 MG TABS, Take by mouth., Disp: , Rfl:    folic acid (FOLVITE) 1 MG tablet, Take 1 mg by mouth daily., Disp: , Rfl:    gabapentin (NEURONTIN) 600 MG tablet, Take 1 tablet (600 mg total) by mouth at bedtime., Disp: 30 tablet, Rfl: 3   ketoconazole (NIZORAL) 2 % cream, Apply topically., Disp: , Rfl:    multivitamin-iron-minerals-folic acid (THERAPEUTIC-M) TABS tablet, Take 1 tablet by mouth daily., Disp: , Rfl:    naproxen (NAPROSYN) 500 MG tablet, Take 1 tablet (500 mg total) by mouth 2 (two) times daily with a meal. As needed for pain, Disp: 60 tablet, Rfl: 2   PREDNISONE, PAK, PO, Prednisone, Disp: , Rfl:    Vitamin D, Ergocalciferol, (DRISDOL) 50000 units CAPS capsule, Take 50,000 Units by mouth every 7 (seven) days., Disp: , Rfl:  Allergies  Allergen Reactions   Aspirin Itching and Shortness Of Breath    Other reaction(s): Other Other reaction(s): Itching, Shortness Of Breath Shortness of breath, vomiting Other reaction(s): Itching, Shortness Of Breath Shortness of breath, vomiting Other reaction(s): Other (See Comments) Other reaction(s):  Itching, Shortness Of Breath Shortness of breath, vomiting Other reaction(s): Itching, Shortness Of Breath Shortness of breath, vomiting    Coconut (Cocos Nucifera) Itching and Other (See Comments)    Sores in mouth Sores in mouth    Coconut Fatty Acid     Coconut in raw form   Family History  Problem Relation Age of Onset   Diabetes Mother    Varicose Veins Mother    Diabetes Paternal Uncle    Hypertension Paternal Uncle    Cancer Maternal  Grandmother    Breast cancer Maternal Grandmother 82   Social History   Socioeconomic History   Marital status: Divorced    Spouse name: Not on file   Number of children: Not on file   Years of education: Not on file   Highest education level: Not on file  Occupational History   Not on file  Tobacco Use   Smoking status: Never   Smokeless tobacco: Never   Tobacco comments:    never used tobacco  Vaping Use   Vaping status: Never Used  Substance and Sexual Activity   Alcohol use: Yes    Comment: socially   Drug use: No   Sexual activity: Yes    Birth control/protection: None  Other Topics Concern   Not on file  Social History Narrative   Not on file   Social Determinants of Health   Financial Resource Strain: Not on file  Food Insecurity: No Food Insecurity (06/12/2023)   Received from [facility]   Hunger Vital Sign    Worried About Running Out of Food in the Last Year: Never true    Ran Out of Food in the Last Year: Never true  Transportation Needs: No Transportation Needs (06/12/2023)   Received from [facility] - Transportation    In the past 12 months, has lack of transportation kept you from medical appointments or from getting medications?: No    Lack of Transportation (Non-Medical): No  Physical Activity: Not on file  Stress: Not on file  Social Connections: Not on file  Intimate Partner Violence: Not on file    Physical Exam: There were no vitals filed for this visit. There is no height or weight on file to calculate BMI. GEN: NAD EYE: Sclerae anicteric ENT: MMM CV: Non-tachycardic GI: Soft, NT/ND NEURO:  Alert & Oriented x 3  Lab Results: No results for input(s): "WBC", "HGB", "HCT", "PLT" in the last 72 hours. BMET No results for input(s): "NA", "K", "CL", "CO2", "GLUCOSE", "BUN", "CREATININE", "CALCIUM" in the last 72 hours. LFT No results for input(s): "PROT", "ALBUMIN", "AST", "ALT", "ALKPHOS",  "BILITOT", "BILIDIR", "IBILI" in the last 72 hours. PT/INR No results for input(s): "LABPROT", "INR" in the last 72 hours.   Impression / Plan: This is a 52 y.o.female who presents for EGD/Colonoscopy for evaluation of abnormal imaging, GERD, Colon Cancer screening.  The risks and benefits of endoscopic evaluation/treatment were discussed with the patient and/or family; these include but are not limited to the risk of perforation, infection, bleeding, missed lesions, lack of diagnosis, severe illness requiring hospitalization, as well as anesthesia and sedation related illnesses.  The patient's history has been reviewed, patient examined, no change in status, and deemed stable for procedure.  The patient and/or family is agreeable to proceed.    Corliss Parish, MD [facility] Office # [phone]

## 2023-07-13 NOTE — Progress Notes (Signed)
Patient evaluated in post-procedure recovery. She is still awakening from anesthesia but has complain of upper chest discomfort. Nonradiating. She is in RR without tachycardia or rubs or murmurs or gallops. Her pulmonary examination is normal. I do not feel any evidence of crepitus in the neck or in the chest at this time. I do not hear any evidence of crepitus in the neck or chest on auscultation. She did have 2 small mucosal wrents just below the UES. Suspect that she is having discomfort from here, with less likelihood for perforation but need to be mindful. Will give 25 mcg Fentanyl and reassess. Patient may benefit from not only PPI and Carafate that I had wanted on procedure note but also GI cocktail which we can send. Will reasssess. Patient and daughter agree with plan.  Corliss Parish, MD [facility] Office # [phone]    Addendum Patient

## 2023-07-13 NOTE — Op Note (Signed)
[facility] Patient Name: [name] [name] Procedure Date: 07/13/2023 2:32 PM MRN: [id number] Endoscopist: Corliss Parish , MD, [id number] Age: 52 Referring MD:  Date of Birth: 1971/01/30 Gender: Female Account #: [id number] Procedure:                Colonoscopy Indications:              Screening for colorectal malignant neoplasm Medicines:                Monitored Anesthesia Care Procedure:                Pre-Anesthesia Assessment:                           - Prior to the procedure, a History and Physical                            was performed, and patient medications and                            allergies were reviewed. The patient's tolerance of                            previous anesthesia was also reviewed. The risks                            and benefits of the procedure and the sedation                            options and risks were discussed with the patient.                            All questions were answered, and informed consent                            was obtained. Prior Anticoagulants: The patient has                            taken no anticoagulant or antiplatelet agents                            except for NSAID medication. ASA Grade Assessment:                            II - A patient with mild systemic disease. After                            reviewing the risks and benefits, the patient was                            deemed in satisfactory condition to undergo the                            procedure.  After obtaining informed consent, the colonoscope                            was passed under direct vision. Throughout the                            procedure, the patient's blood pressure, pulse, and                            oxygen saturations were monitored continuously. The                            [id number]-[id number] Colonoscope [id number] was introduced                            through the anus and advanced to  the 3 cm into the                            ileum. The colonoscopy was performed without                            difficulty. The patient tolerated the procedure.                            The quality of the bowel preparation was good. The                            terminal ileum, ileocecal valve, appendiceal                            orifice, and rectum were photographed. Scope In: 2:57:08 PM Scope Out: 3:09:12 PM Scope Withdrawal Time: 0 hours 7 minutes 38 seconds  Total Procedure Duration: 0 hours 12 minutes 4 seconds  Findings:                 Skin tags were found on perianal exam.                           The digital rectal exam findings include                            hemorrhoids. Pertinent negatives include no                            palpable rectal lesions.                           The terminal ileum and ileocecal valve appeared                            normal.                           Normal mucosa was found in the entire colon.  Non-bleeding non-thrombosed external and internal                            hemorrhoids were found during retroflexion, during                            perianal exam and during digital exam. The                            hemorrhoids were Grade II (internal hemorrhoids                            that prolapse but reduce spontaneously). Complications:            No immediate complications. Estimated Blood Loss:     Estimated blood loss: none. Impression:               - Perianal skin tags found on perianal exam.                           - Hemorrhoids found on digital rectal exam.                           - The examined portion of the ileum was normal.                           - Normal mucosa in the entire examined colon.                           - Non-bleeding non-thrombosed external and internal                            hemorrhoids. Recommendation:           - The patient will be observed  post-procedure,                            until all discharge criteria are met.                           - Discharge patient to home.                           - Patient has a contact number available for                            emergencies. The signs and symptoms of potential                            delayed complications were discussed with the                            patient. Return to normal activities tomorrow.                            Written discharge instructions were provided to the  patient.                           - High fiber diet.                           - Use FiberCon 1-2 tablets PO daily.                           - Continue present medications.                           - Repeat colonoscopy in 10 years for screening                            purposes.                           - The findings and recommendations were discussed                            with the patient.                           - The findings and recommendations were discussed                            with the patient's family. Corliss Parish, MD 07/13/2023 3:21:27 PM

## 2023-07-13 NOTE — Progress Notes (Signed)
Called to room to assist during endoscopic procedure.  Patient ID and intended procedure confirmed with present staff. Received instructions for my participation in the procedure from the performing physician.  

## 2023-07-13 NOTE — Progress Notes (Signed)
Fentanyl 25 mcg giving IV per Dr. Corliss Parish by Jennye Boroughs RN.

## 2023-07-13 NOTE — Telephone Encounter (Signed)
[employer] GI cocktail without Bentyl/Levsin but with Lidocaine and Maalox for this patient to use 3 times daily as needed for next 3-days. Just did EGD and she is having some sore throat pain. The other medications have been sent to her normal pharmacy. Thanks.   Prescription called in to pharmacy

## 2023-07-13 NOTE — Progress Notes (Signed)
Patient discharged to home. Chest pain 0. Alert color pink. Verbal and follows commands. Instructions to call back if needed. Also to pick prescriptions up at pharmacy.

## 2023-07-13 NOTE — Op Note (Signed)
[facility] Patient Name: [name] [name] Procedure Date: 07/13/2023 2:32 PM MRN: [id number] Endoscopist: Corliss Parish , MD, [id number] Age: 52 Referring MD:  Date of Birth: 26-Jun-1971 Gender: Female Account #: [id number] Procedure:                Upper GI endoscopy Indications:              Dysphagia, Heartburn, Abnormal CT of the GI tract Medicines:                Monitored Anesthesia Care Procedure:                Pre-Anesthesia Assessment:                           - Prior to the procedure, a History and Physical                            was performed, and patient medications and                            allergies were reviewed. The patient's tolerance of                            previous anesthesia was also reviewed. The risks                            and benefits of the procedure and the sedation                            options and risks were discussed with the patient.                            All questions were answered, and informed consent                            was obtained. Prior Anticoagulants: The patient has                            taken no anticoagulant or antiplatelet agents                            except for NSAID medication. ASA Grade Assessment:                            II - A patient with mild systemic disease. After                            reviewing the risks and benefits, the patient was                            deemed in satisfactory condition to undergo the                            procedure.  After obtaining informed consent, the endoscope was                            passed under direct vision. Throughout the                            procedure, the patient's blood pressure, pulse, and                            oxygen saturations were monitored continuously. The                            Olympus Scope [id number] was introduced through the                            mouth, and  advanced to the second part of duodenum.                            The upper GI endoscopy was accomplished without                            difficulty. The patient tolerated the procedure. Scope In: Scope Out: Findings:                 No gross lesions were noted in the entire                            esophagus. Biopsies were taken with a cold forceps                            for histology to rule out EoE/LoE. After the rest                            of the EGD was completed, a guidewire was placed                            and the scope was withdrawn. Dilation was performed                            with a Savary dilator with no resistance at 19 mm.                            The dilation site was examined following endoscope                            reinsertion and showed just below the UES, 2 mild                            mucosal disruptions, mild improvement in luminal                            narrowing and no perforation.  The Z-line was regular and was found 38 cm from the                            incisors.                           Striped mildly erythematous mucosa without bleeding                            was found in the gastric antrum.                           No other gross lesions were noted in the entire                            examined stomach. Biopsies were taken with a cold                            forceps for histology and Helicobacter pylori                            testing.                           No gross lesions were noted in the duodenal bulb,                            in the first portion of the duodenum and in the                            second portion of the duodenum. Complications:            No immediate complications. Estimated Blood Loss:     Estimated blood loss was minimal. Impression:               - No gross lesions in the entire esophagus.                            Biopsied. Dilated.                            - Z-line regular, 38 cm from the incisors.                           - Erythematous mucosa in the antrum. No other gross                            lesions in the entire stomach. Biopsied.                           - No gross lesions in the duodenal bulb, in the                            first portion of the duodenum and in the second  portion of the duodenum. Recommendation:           - Proceed to scheduled colonoscopy.                           - Dilation diet as per protocol.                           - Please use Cepacol or Halls Lozenges +/-                            Chloraseptic spray for next 72-96 hours to aid in                            sore thoat should you experience this.                           - Start Omeprazole 20 mg BID. Take for 54-months.                            Then may decrese to once daily for 65-months and                            then may stop.                           - Carafate liquid slurry 1 g twice daily for                            2-weeks.                           - Observe patient's clinical course.                           - Await pathology results.                           - Repeat upper endoscopy PRN for retreatment if                            patient does note improvement in her symptoms and                            they recur in future.                           - The findings and recommendations were discussed                            with the patient.                           - The findings and recommendations were discussed  with the patient's family. Corliss Parish, MD 07/13/2023 3:18:27 PM

## 2023-07-13 NOTE — Patient Instructions (Signed)
Discharge instructions given. Biopsies taken. Please use Cepacol or Halls Lozenges +/-Cloraseptic spray for next 72-96 hours to aid in sore throat should you experience this. Prescriptions sent to pharmacy. Observe patients clinical course. Handouts on Hemorrhoids and Dilatation Diet. YOU HAD AN ENDOSCOPIC PROCEDURE TODAY AT THE [facility]:   Refer to the procedure report that was given to you for any specific questions about what was found during the examination.  If the procedure report does not answer your questions, please call your gastroenterologist to clarify.  If you requested that your care partner not be given the details of your procedure findings, then the procedure report has been included in a sealed envelope for you to review at your convenience later.  YOU SHOULD EXPECT: Some feelings of bloating in the abdomen. Passage of more gas than usual.  Walking can help get rid of the air that was put into your GI tract during the procedure and reduce the bloating. If you had a lower endoscopy (such as a colonoscopy or flexible sigmoidoscopy) you may notice spotting of blood in your stool or on the toilet paper. If you underwent a bowel prep for your procedure, you may not have a normal bowel movement for a few days.  Please Note:  You might notice some irritation and congestion in your nose or some drainage.  This is from the oxygen used during your procedure.  There is no need for concern and it should clear up in a day or so.  SYMPTOMS TO REPORT IMMEDIATELY:  Following lower endoscopy (colonoscopy or flexible sigmoidoscopy):  Excessive amounts of blood in the stool  Significant tenderness or worsening of abdominal pains  Swelling of the abdomen that is new, acute  Fever of 100F or higher  Following upper endoscopy (EGD)  Vomiting of blood or coffee ground material  New chest pain or pain under the shoulder blades  Painful or persistently difficult swallowing  New  shortness of breath  Fever of 100F or higher  Black, tarry-looking stools  For urgent or emergent issues, a gastroenterologist can be reached at any hour by calling [phone]. Do not use MyChart messaging for urgent concerns.    DIET:  We do recommend a small meal at first, but then you may proceed to your regular diet.  Drink plenty of fluids but you should avoid alcoholic beverages for 24 hours.  ACTIVITY:  You should plan to take it easy for the rest of today and you should NOT DRIVE or use heavy machinery until tomorrow (because of the sedation medicines used during the test).    FOLLOW UP: Our staff will call the number listed on your records the next business day following your procedure.  We will call around 7:15- 8:00 am to check on you and address any questions or concerns that you may have regarding the information given to you following your procedure. If we do not reach you, we will leave a message.     If any biopsies were taken you will be contacted by phone or by letter within the next 1-3 weeks.  Please call us at [phone] if you have not heard about the biopsies in 3 weeks.    SIGNATURES/CONFIDENTIALITY: You and/or your care partner have signed paperwork which will be entered into your electronic medical record.  These signatures attest to the fact that that the information above on your After Visit Summary has been reviewed and is understood.  Full responsibility of the confidentiality of  this discharge information lies with you and/or your care-partner.

## 2023-07-13 NOTE — Progress Notes (Signed)
Pt in recovery with monitors in place, VSS. Report given to receiving RN. Bite guard was placed with pt awake to ensure comfort. No dental or soft tissue damage noted. 

## 2023-07-16 ENCOUNTER — Telehealth: Payer: Self-pay | Admitting: *Deleted

## 2023-07-16 NOTE — Telephone Encounter (Signed)
  Follow up Call-     07/13/2023    2:24 PM  Call back number  Post procedure Call Back phone  # [phone]  Permission to leave phone message Yes     Patient questions:  Do you have a fever, pain , or abdominal swelling? No. Pain Score  0 *  Have you tolerated food without any problems? Yes.    Have you been able to return to your normal activities? Yes.    Do you have any questions about your discharge instructions: Diet   No. Medications  No. Follow up visit  No.  Do you have questions or concerns about your Care? No.  Actions: * If pain score is 4 or above: No action needed, pain <4.

## 2023-07-18 LAB — SURGICAL PATHOLOGY

## 2023-07-19 ENCOUNTER — Encounter: Payer: Self-pay | Admitting: Gastroenterology

## 2023-08-22 ENCOUNTER — Other Ambulatory Visit (HOSPITAL_COMMUNITY)
Admission: RE | Admit: 2023-08-22 | Discharge: 2023-08-22 | Disposition: A | Discharge status: Home / Self Care | Payer: Medicaid Other | Source: Ambulatory Visit | Attending: Family Medicine | Admitting: Family Medicine

## 2023-08-22 ENCOUNTER — Ambulatory Visit: Payer: Medicaid Other

## 2023-08-22 VITALS — BP 116/64 | HR 77 | Ht 66.0 in | Wt 221.0 lb

## 2023-08-22 DIAGNOSIS — Z113 Encounter for screening for infections with a predominantly sexual mode of transmission: Secondary | ICD-10-CM | POA: Diagnosis present

## 2023-08-22 DIAGNOSIS — N898 Other specified noninflammatory disorders of vagina: Secondary | ICD-10-CM | POA: Diagnosis present

## 2023-08-22 NOTE — Progress Notes (Signed)
SUBJECTIVE:  52 y.o. female complains of vaginal itching for 3 days. Denies abnormal vaginal bleeding or significant pelvic pain or fever. No UTI symptoms. Denies history of known exposure to STD.  No LMP recorded. Patient has had an ablation.  OBJECTIVE:  She appears well, afebrile. Urine dipstick: not done.  ASSESSMENT:  Vaginal Itching    PLAN:  GC, chlamydia, trichomonas, BVAG, CVAG probe sent to lab. Treatment: To be determined once lab results are received ROV prn if symptoms persist or worsen.

## 2023-08-23 LAB — CERVICOVAGINAL ANCILLARY ONLY
Bacterial Vaginitis (gardnerella): POSITIVE — AB
Candida Glabrata: NEGATIVE
Candida Vaginitis: NEGATIVE
Chlamydia: NEGATIVE
Comment: NEGATIVE
Comment: NEGATIVE
Comment: NEGATIVE
Comment: NEGATIVE
Comment: NEGATIVE
Comment: NORMAL
Neisseria Gonorrhea: NEGATIVE
Trichomonas: NEGATIVE

## 2023-08-24 ENCOUNTER — Other Ambulatory Visit: Payer: Self-pay | Admitting: *Deleted

## 2023-08-24 MED ORDER — METRONIDAZOLE 500 MG PO TABS: 500.0000 mg | ORAL_TABLET | Freq: Two times a day (BID) | ORAL | 0 refills | Status: DC

## 2023-08-24 NOTE — Progress Notes (Signed)
Pt tested positive for BV and Flagyl 500 mg sent to [facility] on [address] [place] per protocol.

## 2023-10-08 ENCOUNTER — Other Ambulatory Visit: Payer: Self-pay | Admitting: Gastroenterology

## 2023-10-08 DIAGNOSIS — K2970 Gastritis, unspecified, without bleeding: Secondary | ICD-10-CM

## 2023-10-08 DIAGNOSIS — K219 Gastro-esophageal reflux disease without esophagitis: Secondary | ICD-10-CM

## 2023-10-08 NOTE — Telephone Encounter (Signed)
Per prescription - patient was to taper off. If patient needs refill, patient should contact office.

## 2023-11-11 ENCOUNTER — Other Ambulatory Visit: Payer: Self-pay | Admitting: Gastroenterology

## 2023-11-11 DIAGNOSIS — K2970 Gastritis, unspecified, without bleeding: Secondary | ICD-10-CM

## 2023-11-11 DIAGNOSIS — K219 Gastro-esophageal reflux disease without esophagitis: Secondary | ICD-10-CM

## 2024-01-16 ENCOUNTER — Encounter: Payer: Self-pay | Admitting: Obstetrics & Gynecology

## 2024-01-16 ENCOUNTER — Ambulatory Visit: Payer: Self-pay | Admitting: Obstetrics & Gynecology

## 2024-01-16 ENCOUNTER — Other Ambulatory Visit (HOSPITAL_COMMUNITY)
Admission: RE | Admit: 2024-01-16 | Discharge: 2024-01-16 | Disposition: A | Discharge status: Home / Self Care | Payer: Self-pay | Source: Ambulatory Visit | Attending: Obstetrics & Gynecology | Admitting: Obstetrics & Gynecology

## 2024-01-16 VITALS — BP 131/81 | HR 64 | Ht 66.0 in | Wt 212.0 lb

## 2024-01-16 DIAGNOSIS — Z01419 Encounter for gynecological examination (general) (routine) without abnormal findings: Secondary | ICD-10-CM

## 2024-01-16 DIAGNOSIS — Z1231 Encounter for screening mammogram for malignant neoplasm of breast: Secondary | ICD-10-CM

## 2024-01-16 DIAGNOSIS — N951 Menopausal and female climacteric states: Secondary | ICD-10-CM

## 2024-01-16 MED ORDER — PAROXETINE HCL 10 MG PO TABS: 10.0000 mg | ORAL_TABLET | Freq: Every day | ORAL | 3 refills | Status: AC

## 2024-01-16 NOTE — Progress Notes (Signed)
 Subjective:     [name] [name] is a 53 y.o. female here for a routine exam.  Current complaints: Pt with no complaints. Denies bleeding since ablation in 2018. Pts uncle is s/p colon cancer. She is s/p colonoscopy. Needs f/u in 3 years.    Gynecologic History No LMP recorded. Patient has had an ablation. Contraception: none Last Pap: 12/27/2022. Results were: normal Last mammogram: 11/02/2022. Results were: normal  Obstetric History OB History  Gravida Para Term Preterm AB Living  3 2 2  1 2   SAB IAB Ectopic Multiple Live Births  1        # Outcome Date GA Lbr Len/2nd Weight Sex Type Anes PTL Lv  3 SAB           2 Term           1 Term              The following portions of the patient's history were reviewed and updated as appropriate: allergies, current medications, past family history, past medical history, past social history, past surgical history, and problem list.  Review of Systems Pertinent items are noted in HPI.    Objective:  BP 132/60   Pulse 82   Ht 5\' 6"  (1.676 m)   Wt 212 lb (96.2 kg)   BMI 34.22 kg/m  General Appearance:    Alert, cooperative, no distress, appears stated age  Head:    Normocephalic, without obvious abnormality, atraumatic  Eyes:    conjunctiva/corneas clear, EOM's intact, both eyes  Ears:    Normal external ear canals, both ears  Nose:   Nares normal, septum midline, mucosa normal, no drainage    or sinus tenderness  Throat:   Lips, mucosa, and tongue normal; teeth and gums normal  Neck:   Supple, symmetrical, trachea midline, no adenopathy;    thyroid :  no enlargement/tenderness/nodules  Back:     Symmetric, no curvature, ROM normal, no CVA tenderness  Lungs:     respirations unlabored  Chest Wall:    No tenderness or deformity   Heart:    Regular rate and rhythm  Breast Exam:    No tenderness, masses, or nipple abnormality  Abdomen:     Soft, non-tender, bowel sounds active all four quadrants,    no masses, no organomegaly   Genitalia:    Normal female without lesion, discharge or tenderness     Extremities:   Extremities normal, atraumatic, no cyanosis or edema  Pulses:   2+ and symmetric all extremities  Skin:   Skin color, texture, turgor normal, no rashes or lesions     Assessment:    Healthy female exam.    Plan:  Derika was seen today for gynecologic exam.  Diagnoses and all orders for this visit:  Well female exam with routine gynecological exam -     Cytology - PAP( [facility])  Menopausal syndrome (hot flushes) -     PARoxetine (PAXIL) 10 MG tablet; Take 1 tablet (10 mg total) by mouth daily.  Breast cancer screening by mammogram -     MM 3D SCREENING MAMMOGRAM BILATERAL BREAST; Future  F/u in 1 year or sooner prn   Aleila Syverson L. Harraway-Smith, M.D., FACOG

## 2024-01-18 ENCOUNTER — Encounter: Payer: Self-pay | Admitting: Obstetrics & Gynecology

## 2024-01-18 LAB — CYTOLOGY - PAP: Diagnosis: NEGATIVE

## 2024-02-21 ENCOUNTER — Encounter (HOSPITAL_BASED_OUTPATIENT_CLINIC_OR_DEPARTMENT_OTHER): Payer: Self-pay

## 2024-02-21 ENCOUNTER — Ambulatory Visit (HOSPITAL_BASED_OUTPATIENT_CLINIC_OR_DEPARTMENT_OTHER)
Admission: RE | Admit: 2024-02-21 | Discharge: 2024-02-21 | Disposition: A | Discharge status: Home / Self Care | Payer: Self-pay | Source: Ambulatory Visit | Attending: Obstetrics & Gynecology | Admitting: Obstetrics & Gynecology

## 2024-02-21 DIAGNOSIS — Z1231 Encounter for screening mammogram for malignant neoplasm of breast: Secondary | ICD-10-CM | POA: Insufficient documentation

## 2024-02-27 ENCOUNTER — Telehealth: Payer: Self-pay | Admitting: Obstetrics & Gynecology

## 2024-09-13 ENCOUNTER — Encounter (HOSPITAL_BASED_OUTPATIENT_CLINIC_OR_DEPARTMENT_OTHER): Payer: Self-pay | Admitting: Emergency Medicine

## 2024-09-13 ENCOUNTER — Other Ambulatory Visit: Payer: Self-pay

## 2024-09-13 ENCOUNTER — Emergency Department (HOSPITAL_BASED_OUTPATIENT_CLINIC_OR_DEPARTMENT_OTHER)
Admission: EM | Admit: 2024-09-13 | Discharge: 2024-09-13 | Disposition: A | Discharge status: Home / Self Care | Attending: Emergency Medicine | Admitting: Emergency Medicine

## 2024-09-13 ENCOUNTER — Emergency Department (HOSPITAL_BASED_OUTPATIENT_CLINIC_OR_DEPARTMENT_OTHER)

## 2024-09-13 DIAGNOSIS — R109 Unspecified abdominal pain: Secondary | ICD-10-CM | POA: Diagnosis not present

## 2024-09-13 DIAGNOSIS — R110 Nausea: Secondary | ICD-10-CM | POA: Diagnosis not present

## 2024-09-13 DIAGNOSIS — M545 Low back pain, unspecified: Secondary | ICD-10-CM | POA: Diagnosis present

## 2024-09-13 LAB — URINALYSIS, W/ REFLEX TO CULTURE (INFECTION SUSPECTED)
Bilirubin Urine: NEGATIVE
Glucose, UA: NEGATIVE mg/dL
Ketones, ur: NEGATIVE mg/dL
Leukocytes,Ua: NEGATIVE
Nitrite: NEGATIVE
Protein, ur: NEGATIVE mg/dL
Specific Gravity, Urine: 1.01 (ref 1.005–1.030)
WBC, UA: NONE SEEN WBC/hpf (ref 0–5)
pH: 7 (ref 5.0–8.0)

## 2024-09-13 LAB — CBC WITH DIFFERENTIAL/PLATELET
Abs Immature Granulocytes: 0 K/uL (ref 0.00–0.07)
Basophils Absolute: 0 K/uL (ref 0.0–0.1)
Basophils Relative: 1 %
Eosinophils Absolute: 0.1 K/uL (ref 0.0–0.5)
Eosinophils Relative: 3 %
HCT: 38.5 % (ref 36.0–46.0)
Hemoglobin: 12.8 g/dL (ref 12.0–15.0)
Immature Granulocytes: 0 %
Lymphocytes Relative: 49 %
Lymphs Abs: 1.3 K/uL (ref 0.7–4.0)
MCH: 29.4 pg (ref 26.0–34.0)
MCHC: 33.2 g/dL (ref 30.0–36.0)
MCV: 88.3 fL (ref 80.0–100.0)
Monocytes Absolute: 0.2 K/uL (ref 0.1–1.0)
Monocytes Relative: 6 %
Neutro Abs: 1.1 K/uL — ABNORMAL LOW (ref 1.7–7.7)
Neutrophils Relative %: 41 %
Platelets: 313 K/uL (ref 150–400)
RBC: 4.36 MIL/uL (ref 3.87–5.11)
RDW: 12.1 % (ref 11.5–15.5)
WBC: 2.7 K/uL — ABNORMAL LOW (ref 4.0–10.5)
nRBC: 0 % (ref 0.0–0.2)

## 2024-09-13 LAB — BASIC METABOLIC PANEL WITH GFR
Anion gap: 11 (ref 5–15)
BUN: 13 mg/dL (ref 6–20)
CO2: 25 mmol/L (ref 22–32)
Calcium: 9.8 mg/dL (ref 8.9–10.3)
Chloride: 107 mmol/L (ref 98–111)
Creatinine, Ser: 0.71 mg/dL (ref 0.44–1.00)
GFR, Estimated: >60 mL/min
Glucose, Bld: 91 mg/dL (ref 70–99)
Potassium: 4.1 mmol/L (ref 3.5–5.1)
Sodium: 142 mmol/L (ref 135–145)

## 2024-09-13 LAB — LIPASE, BLOOD: Lipase: 18 U/L (ref 11–51)

## 2024-09-13 MED ORDER — ONDANSETRON 4 MG PO TBDP
4.0000 mg | ORAL_TABLET | Freq: Once | ORAL | Status: AC
  Administered 2024-09-13: 4 mg via ORAL
  Filled 2024-09-13: qty 1

## 2024-09-13 MED ORDER — DIAZEPAM 5 MG/ML IJ SOLN
2.5000 mg | Freq: Once | INTRAMUSCULAR | Status: AC
  Administered 2024-09-13: 2.5 mg via INTRAVENOUS
  Filled 2024-09-13: qty 2

## 2024-09-13 MED ORDER — ACETAMINOPHEN 500 MG PO TABS
1000.0000 mg | ORAL_TABLET | Freq: Once | ORAL | Status: AC
  Administered 2024-09-13: 1000 mg via ORAL
  Filled 2024-09-13: qty 2

## 2024-09-13 MED ORDER — KETOROLAC TROMETHAMINE 15 MG/ML IJ SOLN
15.0000 mg | Freq: Once | INTRAMUSCULAR | Status: AC
  Administered 2024-09-13: 15 mg via INTRAVENOUS
  Filled 2024-09-13: qty 1

## 2024-09-13 MED ORDER — IOHEXOL 300 MG/ML  SOLN
100.0000 mL | Freq: Once | INTRAMUSCULAR | Status: AC | PRN
  Administered 2024-09-13: 100 mL via INTRAVENOUS

## 2024-09-13 MED ORDER — LIDOCAINE 5 % EX PTCH: 1.0000 | MEDICATED_PATCH | CUTANEOUS | 0 refills | Status: AC

## 2024-09-13 MED ORDER — LIDOCAINE 5 % EX PTCH
1.0000 | MEDICATED_PATCH | Freq: Once | CUTANEOUS | Status: DC
  Administered 2024-09-13: 1 via TRANSDERMAL
  Filled 2024-09-13: qty 1

## 2024-09-13 MED ORDER — METHOCARBAMOL 500 MG PO TABS: 500.0000 mg | ORAL_TABLET | Freq: Two times a day (BID) | ORAL | 0 refills | Status: AC

## 2024-09-13 MED ORDER — MORPHINE SULFATE (PF) 2 MG/ML IV SOLN
2.0000 mg | Freq: Once | INTRAVENOUS | Status: AC
  Administered 2024-09-13: 2 mg via INTRAVENOUS
  Filled 2024-09-13: qty 1

## 2024-09-13 NOTE — ED Notes (Signed)
 Discharge instructions reviewed with patient. Patient verbalizes understanding, no further questions at this time. Medications/prescriptions and follow up information provided. No acute distress noted at time of departure.

## 2024-09-13 NOTE — ED Provider Notes (Signed)
 " [facility] Provider Note   CSN: [id number] Arrival date & time: 09/13/24  9044     Patient presents with: Back Pain   [name] [name] is a 54 y.o. female.   Reports history of myelofibrosis.  She follows at [facility].  Reports that she has had several days of pain in her right lower back that radiates into her abdomen.  Reports that she does have nausea after eating, and pain worsens in her back with movement, denies any urinary or fecal incontinence or retention, denies any saddle anesthesia.  She has been taking her home narcotic pain medication without any significant relief.  She had 1 episode of emesis when she bent over this morning.  The history is provided by the patient. No language interpreter was used.       Prior to Admission medications  Medication Sig Start Date End Date Taking? Authorizing Provider  lidocaine  (LIDODERM ) 5 % Place 1 patch onto the skin daily. Remove & Discard patch within 12 hours or as directed by MD 09/13/24  Yes Hildegard, Aadam Zhen, PA-C  methocarbamol  (ROBAXIN ) 500 MG tablet Take 1 tablet (500 mg total) by mouth 2 (two) times daily. 09/13/24  Yes Jonique Kulig, PA-C  Acetaminophen -Guaifenesin 650-400 MG TABS Take by mouth. 07/26/09   [provider]  albuterol  (VENTOLIN  HFA) 108 (90 Base) MCG/ACT inhaler Inhale into the lungs. 01/12/16   [provider]  folic acid (FOLVITE) 1 MG tablet Take 1 mg by mouth daily.    [provider]  gabapentin  (NEURONTIN ) 600 MG tablet Take 1 tablet (600 mg total) by mouth at bedtime. 10/15/20   Anyanwu, Ugonna A, MD  hydroxychloroquine (PLAQUENIL) 200 MG tablet Plaquenil    [provider]  multivitamin-iron-minerals-folic acid (THERAPEUTIC-M) TABS tablet Take 1 tablet by mouth daily.    [provider]  oxycodone  (OXY-IR) 5 MG capsule Take by mouth. 01/12/16   [provider]  PARoxetine  (PAXIL ) 10 MG tablet Take 1 tablet (10 mg total) by  mouth daily. 01/16/24   Corene Coy, MD  PREDNISONE, PAK, PO Prednisone Patient not taking: Reported on 01/16/2024    [provider]  Vitamin D, Ergocalciferol, (DRISDOL) 50000 units CAPS capsule Take 50,000 Units by mouth every 7 (seven) days.    [provider]    Allergies: Aspirin, Coconut (cocos nucifera), and Coconut fatty acid    Review of Systems  Constitutional:  Negative for chills and fever.  Gastrointestinal:  Positive for abdominal pain and vomiting.  Genitourinary:  Negative for difficulty urinating and dysuria.  Musculoskeletal:  Positive for back pain.  All other systems reviewed and are negative.   Updated Vital Signs BP 124/87   Pulse (!) 57   Temp 98.2 F (36.8 C) (Oral)   Resp 17   Ht 5' 6 (1.676 m)   Wt 101.6 kg   SpO2 100%   BMI 36.15 kg/m   Physical Exam Vitals and nursing note reviewed.  Constitutional:      General: She is not in acute distress.    Appearance: Normal appearance. She is not ill-appearing.  HENT:     Head: Normocephalic and atraumatic.     Nose: Nose normal.  Eyes:     Conjunctiva/sclera: Conjunctivae normal.  Cardiovascular:     Rate and Rhythm: Normal rate and regular rhythm.  Pulmonary:     Effort: Pulmonary effort is normal. No respiratory distress.  Abdominal:     General: There is no  distension.     Palpations: Abdomen is soft.     Tenderness: There is no abdominal tenderness. There is no guarding.  Musculoskeletal:        General: No deformity. Normal range of motion.     Comments: Cervical, thoracic, lumbar spine without tenderness to palpation.  Does have some tenderness to palpation over right lumbar paraspinal region.  Good strength in bilateral lower extremities.  Neurovascularly intact.  Skin:    Findings: No rash.  Neurological:     Mental Status: She is alert.     (all labs ordered are listed, but only abnormal results are displayed) Labs Reviewed  CBC WITH  DIFFERENTIAL/PLATELET - Abnormal; Notable for the following components:      Result Value   WBC 2.7 (*)    Neutro Abs 1.1 (*)    All other components within normal limits  URINALYSIS, W/ REFLEX TO CULTURE (INFECTION SUSPECTED) - Abnormal; Notable for the following components:   Hgb urine dipstick TRACE (*)    Bacteria, UA RARE (*)    All other components within normal limits  BASIC METABOLIC PANEL WITH GFR  LIPASE, BLOOD    EKG: None  Radiology: CT ABDOMEN PELVIS W CONTRAST Result Date: 09/13/2024 EXAM: CT ABDOMEN AND PELVIS WITH CONTRAST 09/13/2024 11:50:45 AM TECHNIQUE: CT of the abdomen and pelvis was performed with the administration of intravenous contrast. Multiplanar reformatted images are provided for review. Automated exposure control, iterative reconstruction, and/or weight-based adjustment of the mA/kV was utilized to reduce the radiation dose to as low as reasonably achievable. COMPARISON: 06/13/2021 CLINICAL HISTORY: RLQ abdominal pain. FINDINGS: LOWER CHEST: No acute abnormality. LIVER: Stable low attenuation in segment 7 of the liver is unchanged from 06/13/2021. Although indeterminate, this is consistent with a benign abnormality such as a cyst or hemangioma. No suspicious liver abnormality. GALLBLADDER AND BILE DUCTS: Gallbladder is unremarkable. No biliary ductal dilatation. SPLEEN: No acute abnormality. PANCREAS: No acute abnormality. ADRENAL GLANDS: No acute abnormality. KIDNEYS, URETERS AND BLADDER: There are 2 subjacent Bosniak class 2 cysts within the anterior cortex of the left kidney measuring up to 1 cm, image 25/2. Per consensus, no follow-up is needed for simple Bosniak type 1 and 2 renal cysts, unless the patient has a malignancy history or risk factors. No stones in the kidneys or ureters. No hydronephrosis. No perinephric or periureteral stranding. Urinary bladder is unremarkable. GI AND BOWEL: Stomach demonstrates no acute abnormality. The appendix is visualized and  appears normal. There is no bowel obstruction. PERITONEUM AND RETROPERITONEUM: No free fluid or fluid collections. No free air. VASCULATURE: Aorta is normal in caliber. LYMPH NODES: No lymphadenopathy. REPRODUCTIVE ORGANS: Uterus appears normal. No adnexal mass. Simple-appearing cyst within the left ovary measures 2 cm, image 66/2. BONES AND SOFT TISSUES: No acute osseous abnormality. No focal soft tissue abnormality. IMPRESSION: 1. No acute findings in the abdomen or pelvis, with normal appendix. Electronically signed by: Waddell Calk MD 09/13/2024 12:14 PM EST RP Workstation: [id number]     Procedures   Medications Ordered in the ED  lidocaine  (LIDODERM ) 5 % 1 patch (1 patch Transdermal Patch Applied 09/13/24 1149)  ondansetron  (ZOFRAN -ODT) disintegrating tablet 4 mg (4 mg Oral Given 09/13/24 1230)  acetaminophen  (TYLENOL ) tablet 1,000 mg (1,000 mg Oral Given 09/13/24 1150)  ketorolac  (TORADOL ) 15 MG/ML injection 15 mg (15 mg Intravenous Given 09/13/24 1150)  iohexol  (OMNIPAQUE ) 300 MG/ML solution 100 mL (100 mLs Intravenous Contrast Given 09/13/24 1130)  diazepam  (VALIUM ) injection 2.5 mg (2.5 mg Intravenous Given 09/13/24  1251)  morphine  (PF) 2 MG/ML injection 2 mg (2 mg Intravenous Given 09/13/24 1251)                                    Medical Decision Making Amount and/or Complexity of Data Reviewed Labs: ordered. Radiology: ordered.  Risk OTC drugs. Prescription drug management.   Medical Decision Making / ED Course   This patient presents to the ED for concern of low back pain, this involves an extensive number of treatment options, and is a complaint that carries with it a high risk of complications and morbidity.  The differential diagnosis includes muscle strain, muscle spasm, colitis, appendicitis, diverticulitis, nephrolithiasis, pyelonephritis, cauda equina syndrome, spinal epidural abscess  MDM: 54 year old female presents with above-mentioned complaints.  Uncomfortable  appearing secondary to pain. Labs reassuring without acute concern.  CBC without leukocytosis or anemia.  BMP without acute concern.  Lipase within normal.  UA without evidence of UTI.  CT abdomen pelvis without acute intra-abdominal or pelvic finding.  Some leukopenia but this does not appear to be new.  She is without any red flag signs or symptoms concerning for cauda equina syndrome or spinal epidural abscess  After multimodal pain control she does have some improvement.  We discussed multimodal pain control at home and follow-up with rheumatology. She is in agreement with this plan.  Discharged in stable condition. Discussed with attending.  Lab Tests: -I ordered, reviewed, and interpreted labs.   The pertinent results include:   Labs Reviewed  CBC WITH DIFFERENTIAL/PLATELET - Abnormal; Notable for the following components:      Result Value   WBC 2.7 (*)    Neutro Abs 1.1 (*)    All other components within normal limits  URINALYSIS, W/ REFLEX TO CULTURE (INFECTION SUSPECTED) - Abnormal; Notable for the following components:   Hgb urine dipstick TRACE (*)    Bacteria, UA RARE (*)    All other components within normal limits  BASIC METABOLIC PANEL WITH GFR  LIPASE, BLOOD      EKG  EKG Interpretation Date/Time:    Ventricular Rate:    PR Interval:    QRS Duration:    QT Interval:    QTC Calculation:   R Axis:      Text Interpretation:           Imaging Studies ordered: I ordered imaging studies including CT abdomen pelvis with contrast I independently visualized and interpreted imaging. I agree with the radiologist interpretation   Medicines ordered and prescription drug management: Meds ordered this encounter  Medications   ondansetron  (ZOFRAN -ODT) disintegrating tablet 4 mg   acetaminophen  (TYLENOL ) tablet 1,000 mg   lidocaine  (LIDODERM ) 5 % 1 patch   ketorolac  (TORADOL ) 15 MG/ML injection 15 mg   iohexol  (OMNIPAQUE ) 300 MG/ML solution 100 mL    diazepam  (VALIUM ) injection 2.5 mg   morphine  (PF) 2 MG/ML injection 2 mg   methocarbamol  (ROBAXIN ) 500 MG tablet    Sig: Take 1 tablet (500 mg total) by mouth 2 (two) times daily.    Dispense:  20 tablet    Refill:  0    Supervising Provider:   CLEOTILDE ROGUE [[id number]]   lidocaine  (LIDODERM ) 5 %    Sig: Place 1 patch onto the skin daily. Remove & Discard patch within 12 hours or as directed by MD    Dispense:  14 patch    Refill:  0  Supervising Provider:   CLEOTILDE ROGUE [phone]    -I have reviewed the patients home medicines and have made adjustments as needed  Social Determinants of Health:  Factors impacting patients care include: Good outpatient follow-up   Reevaluation: After the interventions noted above, I reevaluated the patient and found that they have :improved  Co morbidities that complicate the patient evaluation  Past Medical History:  Diagnosis Date   Anemia    Arthritis    Myleofibrosis- bone marrow deteriorating   Blood transfusion without reported diagnosis    Chronic headaches    Myelofibrosis (HCC)    Pericarditis 2004-2007   Reoccuring: without flare since 2007   Shortness of breath dyspnea    on exertion when having flare up of mylofibrosis or iron deficient      Dispostion: Discharged in stable condition.  Return precaution discussed.  Patient voices understanding and is in agreement with plan.  Final diagnoses:  Bilateral low back pain without sciatica, unspecified chronicity    ED Discharge Orders          Ordered    methocarbamol  (ROBAXIN ) 500 MG tablet  2 times daily        09/13/24 1350    lidocaine  (LIDODERM ) 5 %  Every 24 hours        09/13/24 1350               Hildegard Loge, PA-C 09/13/24 1356  "

## 2024-09-13 NOTE — ED Provider Triage Note (Signed)
 Emergency Medicine Provider Triage Evaluation Note  [name] [name] , a 54 y.o. female  was evaluated in triage.  Reports a myelofibrosis.  Reports that she has had several days of pain in her right lower back that radiates into her abdomen.  Reports that she does have nausea after eating, and pain worsens in her back with movement, denies any urinary or fecal incontinence or retention, denies any saddle anesthesia.  Review of Systems  Positive: Back pain, abdominal pain, nausea Negative: Above  Physical Exam  There were no vitals taken for this visit. Gen:   Awake, no distress   Resp:  Normal effort  MSK:   Moves extremities without difficulty, tender to palpation in the right paraspinal lumbar musculature Other:  Able to ambulate independently, though with slow gait  Medical Decision Making  Medically screening exam initiated at 10:11 AM.  Appropriate orders placed.  ZANIYA MCAULAY was informed that the remainder of the evaluation will be completed by another provider, this initial triage assessment does not replace that evaluation, and the importance of remaining in the ED until their evaluation is complete.   Rogelia Jerilynn RAMAN, MD 09/13/24 1014

## 2024-09-13 NOTE — Discharge Instructions (Addendum)
 Your level improved slightly after some medicines in the emergency department.  Your blood work and CT scan did not show any concerning findings.  Continue taking your home pain medication, the pregabalin.  Continue the prednisone taper that was prescribed to you.  I have prescribed you Robaxin  which is a muscle relaxer.  This will make you drowsy so do not do anything else dangerous after taking this medication.  If your pain medication has Tylenol  in it already take an additional 500 mg of Tylenol  every 6 hours.  If it does not have Tylenol  you can take up to 1000 mg of Tylenol  every 6 hours.  Do not exceed a total of 4000 mg within a 24-hour period.  For additional pain control take occasional ibuprofen 600 mg. Return for any emergent symptoms.  Please follow-up with your rheumatologist.  Follow-up with your primary care doctor in the meantime.

## 2024-09-13 NOTE — ED Triage Notes (Signed)
 Pt reports low back pain x several days with suprapubic pain, reports some nausea and lightheadedness earlier today
